# Patient Record
Sex: Male | Born: 1965
Health system: Southern US, Community
[De-identification: ages and names within clinical notes are randomized; demographics above are authoritative.]

## PROBLEM LIST (undated history)

## (undated) DIAGNOSIS — E785 Hyperlipidemia, unspecified: Secondary | ICD-10-CM

## (undated) DIAGNOSIS — I10 Essential (primary) hypertension: Secondary | ICD-10-CM

## (undated) HISTORY — DX: Essential (primary) hypertension: I10

## (undated) HISTORY — DX: Hyperlipidemia, unspecified: E78.5

---

## 2000-03-27 ENCOUNTER — Emergency Department (HOSPITAL_COMMUNITY): Admission: EM | Admit: 2000-03-27 | Discharge: 2000-03-27 | Payer: Self-pay | Admitting: Emergency Medicine

## 2000-03-27 ENCOUNTER — Encounter: Payer: Self-pay | Admitting: Emergency Medicine

## 2006-01-19 ENCOUNTER — Emergency Department (HOSPITAL_COMMUNITY): Admission: EM | Admit: 2006-01-19 | Discharge: 2006-01-19 | Payer: Self-pay | Admitting: Emergency Medicine

## 2008-07-06 ENCOUNTER — Emergency Department (HOSPITAL_COMMUNITY): Admission: EM | Admit: 2008-07-06 | Discharge: 2008-07-06 | Payer: Self-pay | Admitting: Emergency Medicine

## 2008-07-10 ENCOUNTER — Emergency Department (HOSPITAL_COMMUNITY): Admission: EM | Admit: 2008-07-10 | Discharge: 2008-07-10 | Payer: Self-pay | Admitting: *Deleted
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
## 2013-02-16 ENCOUNTER — Ambulatory Visit (INDEPENDENT_AMBULATORY_CARE_PROVIDER_SITE_OTHER): Payer: 59 | Admitting: Family Medicine

## 2013-02-16 VITALS — BP 149/93 | HR 64 | Temp 98.2°F | Resp 18 | Ht 74.0 in | Wt 238.0 lb

## 2013-02-16 DIAGNOSIS — H109 Unspecified conjunctivitis: Secondary | ICD-10-CM

## 2013-02-16 MED ORDER — OFLOXACIN 0.3 % OP SOLN
2.0000 [drp] | Freq: Four times a day (QID) | OPHTHALMIC | Status: DC
Start: 2013-02-16 — End: 2014-08-18

## 2013-02-16 NOTE — Progress Notes (Signed)
[address]   [place], [place]  [address]   [phone]  Subjective:    Patient ID: Travis Tyler, male    DOB: [birth year], 47 y.o.   MRN: [id number]  HPI This 47 y.o. male presents for evaluation of conjunctivitis L.  Onset yesterday.  Upon awakening, eye matted together with white discharge.  Normal vision; no blurred vision.  Leaking; watering.  +irritated; mild itching.  No pain but irritated.  No photophobia.  Intermittent foreign body sensation.  White drainage.  +throat sore since yesterday.  No rhinorrhea; no nasal congestion.  Visine to eye.  No sneezing.  No contacts or glasses.   Review of Systems  Constitutional: Negative for fever, chills, diaphoresis and fatigue.  HENT: Positive for sore throat. Negative for ear pain, congestion, rhinorrhea, sneezing, trouble swallowing, voice change and postnasal drip.   Eyes: Positive for discharge, redness and itching. Negative for photophobia, pain and visual disturbance.  Respiratory: Negative for cough.   Gastrointestinal: Negative for nausea, vomiting and diarrhea.  Skin: Negative for rash.  Neurological: Negative for headaches.    History reviewed. No pertinent past medical history.  History reviewed. No pertinent past surgical history.  Prior to Admission medications   Medication Sig Start Date End Date Taking? Authorizing Provider  ofloxacin (OCUFLOX) 0.3 % ophthalmic solution Place 2 drops into the left eye 4 (four) times daily. 02/16/13   Ethelda Chick, MD    No Known Allergies  History   Social History  . Marital Status: Married    Spouse Name: N/A    Number of Children: N/A  . Years of Education: N/A   Occupational History  . Not on file.   Social History Main Topics  . Smoking status: Current Every Day Smoker  . Smokeless tobacco: Not on file  . Alcohol Use: Yes  . Drug Use: No  . Sexually Active: Yes    Birth Control/ Protection: None   Other Topics Concern  . Not on file   Social History  Narrative  . No narrative on file    Family History  Problem Relation Age of Onset  . Cancer Father        Objective:   Physical Exam  Nursing note and vitals reviewed. Constitutional: He appears well-developed and well-nourished. No distress.  HENT:  Head: Normocephalic and atraumatic.  Right Ear: External ear normal.  Left Ear: External ear normal.  Nose: Nose normal.  Mouth/Throat: Oropharynx is clear and moist.  Eyes: EOM are normal. Pupils are equal, round, and reactive to light. Right eye exhibits no discharge, no exudate and no hordeolum. No foreign body present in the right eye. Left eye exhibits discharge. Left eye exhibits no hordeolum. No foreign body present in the left eye. Right conjunctiva is not injected. Right conjunctiva has no hemorrhage. Left conjunctiva is injected. Left conjunctiva has no hemorrhage. Right eye exhibits normal extraocular motion and no nystagmus. Left eye exhibits normal extraocular motion and no nystagmus.  Neck: Normal range of motion.  Cardiovascular: Normal rate and regular rhythm.   Pulmonary/Chest: Effort normal and breath sounds normal.  Lymphadenopathy:    He has no cervical adenopathy.  Skin: He is not diaphoretic.  Psychiatric: He has a normal mood and affect. His behavior is normal.   PROCEDURE NOTE: EYE INSPECTED; NO FOREIGN BODY; FLUORESCEIN APPLIED; NO UPTAKE.    Assessment & Plan:  Conjunctivitis - Plan: ofloxacin (OCUFLOX) 0.3 % ophthalmic solution   1.  L conjunctivitis:  New.  Associated with sore throat; consistent with viral syndrome yet mild to moderate drainage; thus, empirically treat with Ocuflox two gtts every six hours x 7 days.  OOW note x 1 day. 2. Pharyngitis: New. Consistent with viral syndrome; supportive care with rest, fluids.  Meds ordered this encounter  Medications  . ofloxacin (OCUFLOX) 0.3 % ophthalmic solution    Sig: Place 2 drops into the left eye 4 (four) times daily.    Dispense:  10 mL     Refill:  0

## 2013-02-16 NOTE — Patient Instructions (Addendum)
Conjunctivitis Conjunctivitis is commonly called "pink eye." Conjunctivitis can be caused by bacterial or viral infection, allergies, or injuries. There is usually redness of the lining of the eye, itching, discomfort, and sometimes discharge. There may be deposits of matter along the eyelids. A viral infection usually causes a watery discharge, while a bacterial infection causes a yellowish, thick discharge. Pink eye is very contagious and spreads by direct contact. You may be given antibiotic eyedrops as part of your treatment. Before using your eye medicine, remove all drainage from the eye by washing gently with warm water and cotton balls. Continue to use the medication until you have awakened 2 mornings in a row without discharge from the eye. Do not rub your eye. This increases the irritation and helps spread infection. Use separate towels from other household members. Wash your hands with soap and water before and after touching your eyes. Use cold compresses to reduce pain and sunglasses to relieve irritation from light. Do not wear contact lenses or wear eye makeup until the infection is gone. SEEK MEDICAL CARE IF:   Your symptoms are not better after 3 days of treatment.  You have increased pain or trouble seeing.  The outer eyelids become very red or swollen. Document Released: 11/06/2004 Document Revised: 12/22/2011 Document Reviewed: 09/29/2005 ExitCare Patient Information 2013 [employer].  

## 2013-04-26 ENCOUNTER — Ambulatory Visit
Admission: RE | Admit: 2013-04-26 | Discharge: 2013-04-26 | Disposition: A | Discharge status: Home / Self Care | Payer: 59 | Source: Ambulatory Visit | Attending: Orthopedic Surgery | Admitting: Orthopedic Surgery

## 2013-04-26 ENCOUNTER — Other Ambulatory Visit: Payer: Self-pay | Admitting: Orthopedic Surgery

## 2013-04-26 DIAGNOSIS — Z139 Encounter for screening, unspecified: Secondary | ICD-10-CM

## 2013-04-26 DIAGNOSIS — M25572 Pain in left ankle and joints of left foot: Secondary | ICD-10-CM

## 2013-04-26 DIAGNOSIS — M25472 Effusion, left ankle: Secondary | ICD-10-CM

## 2013-04-27 ENCOUNTER — Ambulatory Visit
Admission: RE | Admit: 2013-04-27 | Discharge: 2013-04-27 | Disposition: A | Discharge status: Home / Self Care | Payer: 59 | Source: Ambulatory Visit | Attending: Orthopedic Surgery | Admitting: Orthopedic Surgery

## 2013-04-27 DIAGNOSIS — M25572 Pain in left ankle and joints of left foot: Secondary | ICD-10-CM

## 2013-04-27 DIAGNOSIS — M25472 Effusion, left ankle: Secondary | ICD-10-CM

## 2014-08-15 ENCOUNTER — Other Ambulatory Visit: Payer: Self-pay | Admitting: Family Medicine

## 2014-08-15 ENCOUNTER — Other Ambulatory Visit: Payer: 59

## 2014-08-15 DIAGNOSIS — E785 Hyperlipidemia, unspecified: Secondary | ICD-10-CM

## 2014-08-15 DIAGNOSIS — Z0000 Encounter for general adult medical examination without abnormal findings: Secondary | ICD-10-CM

## 2014-08-15 DIAGNOSIS — Z79899 Other long term (current) drug therapy: Secondary | ICD-10-CM

## 2014-08-15 DIAGNOSIS — I10 Essential (primary) hypertension: Secondary | ICD-10-CM

## 2014-08-15 LAB — CBC WITH DIFFERENTIAL/PLATELET
Basophils Absolute: 0 10*3/uL (ref 0.0–0.1)
Basophils Relative: 0 % (ref 0–1)
EOS PCT: 1 % (ref 0–5)
Eosinophils Absolute: 0 10*3/uL (ref 0.0–0.7)
HCT: 39.5 % (ref 39.0–52.0)
HEMOGLOBIN: 14 g/dL (ref 13.0–17.0)
LYMPHS ABS: 1.9 10*3/uL (ref 0.7–4.0)
LYMPHS PCT: 50 % — AB (ref 12–46)
MCH: 31.5 pg (ref 26.0–34.0)
MCHC: 35.4 g/dL (ref 30.0–36.0)
MCV: 89 fL (ref 78.0–100.0)
MONOS PCT: 7 % (ref 3–12)
Monocytes Absolute: 0.3 10*3/uL (ref 0.1–1.0)
NEUTROS PCT: 42 % — AB (ref 43–77)
Neutro Abs: 1.6 10*3/uL — ABNORMAL LOW (ref 1.7–7.7)
PLATELETS: 257 10*3/uL (ref 150–400)
RBC: 4.44 MIL/uL (ref 4.22–5.81)
RDW: 13.2 % (ref 11.5–15.5)
WBC: 3.7 10*3/uL — AB (ref 4.0–10.5)

## 2014-08-15 LAB — LIPID PANEL
CHOL/HDL RATIO: 4.5 ratio
CHOLESTEROL: 200 mg/dL (ref 0–200)
HDL: 44 mg/dL (ref >39)
LDL Cholesterol: 135 mg/dL — ABNORMAL HIGH (ref 0–99)
Triglycerides: 104 mg/dL (ref <150)
VLDL: 21 mg/dL (ref 0–40)

## 2014-08-15 LAB — COMPLETE METABOLIC PANEL WITH GFR
ALK PHOS: 68 U/L (ref 39–117)
ALT: 14 U/L (ref 0–53)
AST: 17 U/L (ref 0–37)
Albumin: 4.4 g/dL (ref 3.5–5.2)
BILIRUBIN TOTAL: 1 mg/dL (ref 0.2–1.2)
BUN: 15 mg/dL (ref 6–23)
CO2: 28 mEq/L (ref 19–32)
Calcium: 9.1 mg/dL (ref 8.4–10.5)
Chloride: 106 mEq/L (ref 96–112)
Creat: 1.33 mg/dL (ref 0.50–1.35)
GFR, EST NON AFRICAN AMERICAN: 64 mL/min
GFR, Est African American: 74 mL/min
GLUCOSE: 88 mg/dL (ref 70–99)
Potassium: 4.5 mEq/L (ref 3.5–5.3)
Sodium: 140 mEq/L (ref 135–145)
Total Protein: 6.6 g/dL (ref 6.0–8.3)

## 2014-08-15 LAB — TSH: TSH: 0.946 u[IU]/mL (ref 0.350–4.500)

## 2014-08-18 ENCOUNTER — Encounter: Payer: Self-pay | Admitting: Family Medicine

## 2014-08-18 ENCOUNTER — Ambulatory Visit (INDEPENDENT_AMBULATORY_CARE_PROVIDER_SITE_OTHER): Payer: 59 | Admitting: Family Medicine

## 2014-08-18 VITALS — BP 118/76 | HR 72 | Temp 98.5°F | Resp 16 | Ht 72.0 in | Wt 236.0 lb

## 2014-08-18 DIAGNOSIS — Z23 Encounter for immunization: Secondary | ICD-10-CM

## 2014-08-18 DIAGNOSIS — Z0000 Encounter for general adult medical examination without abnormal findings: Secondary | ICD-10-CM

## 2014-08-18 NOTE — Progress Notes (Signed)
Subjective:    Patient ID: Travis Tyler, male    DOB: July 04, 1966, 48 y.o.   MRN: [id number]  HPIis a very pleasant 48 year old African-American male who is here today for complete physical exam. He has no concerns. His most recent lab work is listed below: Lab on 08/15/2014  Component Date Value Ref Range Status  . WBC 08/15/2014 3.7* 4.0 - 10.5 K/uL Final  . RBC 08/15/2014 4.44  4.22 - 5.81 MIL/uL Final  . Hemoglobin 08/15/2014 14.0  13.0 - 17.0 g/dL Final  . HCT 08/15/2014 39.5  39.0 - 52.0 % Final  . MCV 08/15/2014 89.0  78.0 - 100.0 fL Final  . MCH 08/15/2014 31.5  26.0 - 34.0 pg Final  . MCHC 08/15/2014 35.4  30.0 - 36.0 g/dL Final  . RDW 08/15/2014 13.2  11.5 - 15.5 % Final  . Platelets 08/15/2014 257  150 - 400 K/uL Final  . Neutrophils Relative % 08/15/2014 42* 43 - 77 % Final  . Neutro Abs 08/15/2014 1.6* 1.7 - 7.7 K/uL Final  . Lymphocytes Relative 08/15/2014 50* 12 - 46 % Final  . Lymphs Abs 08/15/2014 1.9  0.7 - 4.0 K/uL Final  . Monocytes Relative 08/15/2014 7  3 - 12 % Final  . Monocytes Absolute 08/15/2014 0.3  0.1 - 1.0 K/uL Final  . Eosinophils Relative 08/15/2014 1  0 - 5 % Final  . Eosinophils Absolute 08/15/2014 0.0  0.0 - 0.7 K/uL Final  . Basophils Relative 08/15/2014 0  0 - 1 % Final  . Basophils Absolute 08/15/2014 0.0  0.0 - 0.1 K/uL Final  . Smear Review 08/15/2014 Criteria for review not met   Final  . Cholesterol 08/15/2014 200  0 - 200 mg/dL Final   Comment: ATP III Classification:       < 200        mg/dL        Desirable      200 - 239     mg/dL        Borderline High      >= 240        mg/dL        High     . Triglycerides 08/15/2014 104  <150 mg/dL Final  . HDL 08/15/2014 44  >39 mg/dL Final  . Total CHOL/HDL Ratio 08/15/2014 4.5   Final  . VLDL 08/15/2014 21  0 - 40 mg/dL Final  . LDL Cholesterol 08/15/2014 135* 0 - 99 mg/dL Final   Comment:   Total Cholesterol/HDL Ratio:CHD Risk                        Coronary Heart Disease Risk  Table                                        Men       Women          1/2 Average Risk              3.4        3.3              Average Risk              5.0        4.4           2X Average Risk  9.6        7.1           3X Average Risk             23.4       11.0 Use the calculated Patient Ratio above and the CHD Risk table  to determine the patient's CHD Risk. ATP III Classification (LDL):       < 100        mg/dL         Optimal      100 - 129     mg/dL         Near or Above Optimal      130 - 159     mg/dL         Borderline High      160 - 189     mg/dL         High       > 190        mg/dL         Very High     . Sodium 08/15/2014 140  135 - 145 mEq/L Final  . Potassium 08/15/2014 4.5  3.5 - 5.3 mEq/L Final  . Chloride 08/15/2014 106  96 - 112 mEq/L Final  . CO2 08/15/2014 28  19 - 32 mEq/L Final  . Glucose, Bld 08/15/2014 88  70 - 99 mg/dL Final  . BUN 08/15/2014 15  6 - 23 mg/dL Final  . Creat 08/15/2014 1.33  0.50 - 1.35 mg/dL Final  . Total Bilirubin 08/15/2014 1.0  0.2 - 1.2 mg/dL Final  . Alkaline Phosphatase 08/15/2014 68  39 - 117 U/L Final  . AST 08/15/2014 17  0 - 37 U/L Final  . ALT 08/15/2014 14  0 - 53 U/L Final  . Total Protein 08/15/2014 6.6  6.0 - 8.3 g/dL Final  . Albumin 08/15/2014 4.4  3.5 - 5.2 g/dL Final  . Calcium 08/15/2014 9.1  8.4 - 10.5 mg/dL Final  . GFR, Est African American 08/15/2014 74   Final  . GFR, Est Non African American 08/15/2014 64   Final   Comment:   The estimated GFR is a calculation valid for adults (>=58 years old) that uses the CKD-EPI algorithm to adjust for age and sex. It is   not to be used for children, pregnant women, hospitalized patients,    patients on dialysis, or with rapidly changing kidney function. According to the NKDEP, eGFR >89 is normal, 60-89 shows mild impairment, 30-59 shows moderate impairment, 15-29 shows severe impairment and <15 is ESRD.     Marland Kitchen TSH 08/15/2014 0.946  0.350 - 4.500 uIU/mL  Final   No past medical history on file. No past surgical history on file. No current outpatient prescriptions on file prior to visit.   No current facility-administered medications on file prior to visit.   No Known Allergies History   Social History  . Marital Status: Married    Spouse Name: N/A    Number of Children: N/A  . Years of Education: N/A   Occupational History  . Not on file.   Social History Main Topics  . Smoking status: Former Research scientist (life sciences)  . Smokeless tobacco: Never Used     Comment: Uses e-cig  . Alcohol Use: 0.0 oz/week    0 Not specified per week  . Drug Use: No  . Sexual Activity: Yes    Birth Control/ Protection: None   Other Topics Concern  .  Not on file   Social History Narrative   Family History  Problem Relation Age of Onset  . Cancer Father       Review of Systems  All other systems reviewed and are negative.      Objective:   Physical Exam  Constitutional: He is oriented to person, place, and time. He appears well-developed and well-nourished. No distress.  HENT:  Head: Normocephalic and atraumatic.  Right Ear: External ear normal.  Left Ear: External ear normal.  Nose: Nose normal.  Mouth/Throat: Oropharynx is clear and moist. No oropharyngeal exudate.  Eyes: Conjunctivae and EOM are normal. Pupils are equal, round, and reactive to light. Right eye exhibits no discharge. Left eye exhibits no discharge. No scleral icterus.  Neck: Normal range of motion. Neck supple. No JVD present. No tracheal deviation present. No thyromegaly present.  Cardiovascular: Normal rate, regular rhythm, normal heart sounds and intact distal pulses.  Exam reveals no gallop and no friction rub.   No murmur heard. Pulmonary/Chest: Effort normal and breath sounds normal. No stridor. No respiratory distress. He has no wheezes. He has no rales. He exhibits no tenderness.  Abdominal: Soft. Bowel sounds are normal. He exhibits no distension and no mass. There is no  tenderness. There is no rebound and no guarding.  Genitourinary: Rectum normal and prostate normal.  Musculoskeletal: Normal range of motion. He exhibits no edema or tenderness.  Lymphadenopathy:    He has no cervical adenopathy.  Neurological: He is alert and oriented to person, place, and time. He has normal reflexes. He displays normal reflexes. No cranial nerve deficit. He exhibits normal muscle tone. Coordination normal.  Skin: Skin is warm. No rash noted. He is not diaphoretic. No erythema. No pallor.  Psychiatric: He has a normal mood and affect. His behavior is normal. Judgment and thought content normal.  Vitals reviewed.         Assessment & Plan:  Routine general medical examination at a health care facility  Patient's lab work is excellent. I did recommend that he decrease his consumption of saturated fat including red meat and pork to try to decrease his LDL cholesterol below 130. He will get his flu shot at work. Patient received his Tdap today in clinic. He is not yet due for colonoscopy. Prostate cancer screening was recommended today. His exam was normal there is no evidence of prostate nodules on exam. I will add a PSA to his lab work. I did recommend that he discontinue his use of e-cigs/vaps.

## 2014-08-18 NOTE — Addendum Note (Signed)
Addended by: Legrand RamsWILLIS, Saysha Menta B on: 08/18/2014 10:40 AM   Modules accepted: Orders

## 2014-08-19 LAB — PSA: PSA: 0.42 ng/mL (ref <=4.00)

## 2014-08-23 ENCOUNTER — Encounter: Payer: Self-pay | Admitting: Family Medicine

## 2015-08-21 ENCOUNTER — Other Ambulatory Visit: Payer: Commercial Managed Care - HMO

## 2015-08-21 DIAGNOSIS — E785 Hyperlipidemia, unspecified: Secondary | ICD-10-CM

## 2015-08-21 DIAGNOSIS — I10 Essential (primary) hypertension: Secondary | ICD-10-CM

## 2015-08-21 DIAGNOSIS — Z0000 Encounter for general adult medical examination without abnormal findings: Secondary | ICD-10-CM

## 2015-08-21 LAB — CBC WITH DIFFERENTIAL/PLATELET
BASOS ABS: 0 10*3/uL (ref 0.0–0.1)
Basophils Relative: 0 % (ref 0–1)
EOS ABS: 0 10*3/uL (ref 0.0–0.7)
EOS PCT: 1 % (ref 0–5)
HCT: 41.2 % (ref 39.0–52.0)
Hemoglobin: 14.1 g/dL (ref 13.0–17.0)
Lymphocytes Relative: 42 % (ref 12–46)
Lymphs Abs: 1.4 10*3/uL (ref 0.7–4.0)
MCH: 31.5 pg (ref 26.0–34.0)
MCHC: 34.2 g/dL (ref 30.0–36.0)
MCV: 92.2 fL (ref 78.0–100.0)
MPV: 9.9 fL (ref 8.6–12.4)
Monocytes Absolute: 0.2 10*3/uL (ref 0.1–1.0)
Monocytes Relative: 7 % (ref 3–12)
Neutro Abs: 1.7 10*3/uL (ref 1.7–7.7)
Neutrophils Relative %: 50 % (ref 43–77)
PLATELETS: 223 10*3/uL (ref 150–400)
RBC: 4.47 MIL/uL (ref 4.22–5.81)
RDW: 13.2 % (ref 11.5–15.5)
WBC: 3.3 10*3/uL — AB (ref 4.0–10.5)

## 2015-08-21 LAB — COMPLETE METABOLIC PANEL WITH GFR
ALBUMIN: 4.2 g/dL (ref 3.6–5.1)
ALT: 13 U/L (ref 9–46)
AST: 16 U/L (ref 10–40)
Alkaline Phosphatase: 74 U/L (ref 40–115)
BUN: 17 mg/dL (ref 7–25)
CALCIUM: 9.5 mg/dL (ref 8.6–10.3)
CHLORIDE: 107 mmol/L (ref 98–110)
CO2: 26 mmol/L (ref 20–31)
CREATININE: 1.11 mg/dL (ref 0.60–1.35)
GFR, Est African American: >89 mL/min (ref >=60)
GFR, Est Non African American: 79 mL/min (ref >=60)
GLUCOSE: 76 mg/dL (ref 70–99)
Potassium: 4.6 mmol/L (ref 3.5–5.3)
SODIUM: 142 mmol/L (ref 135–146)
Total Bilirubin: 0.9 mg/dL (ref 0.2–1.2)
Total Protein: 6.8 g/dL (ref 6.1–8.1)

## 2015-08-21 LAB — TSH: TSH: 1.186 u[IU]/mL (ref 0.350–4.500)

## 2015-08-21 LAB — LIPID PANEL
Cholesterol: 176 mg/dL (ref 125–200)
HDL: 48 mg/dL (ref >=40)
LDL Cholesterol: 116 mg/dL (ref <130)
TRIGLYCERIDES: 59 mg/dL (ref <150)
Total CHOL/HDL Ratio: 3.7 Ratio (ref <=5.0)
VLDL: 12 mg/dL (ref <30)

## 2015-08-22 LAB — PSA: PSA: 0.57 ng/mL (ref <=4.00)

## 2015-08-24 ENCOUNTER — Encounter: Payer: Self-pay | Admitting: Family Medicine

## 2015-08-24 ENCOUNTER — Ambulatory Visit (INDEPENDENT_AMBULATORY_CARE_PROVIDER_SITE_OTHER): Payer: Commercial Managed Care - HMO | Admitting: Family Medicine

## 2015-08-24 VITALS — BP 128/88 | HR 60 | Temp 98.8°F | Resp 14 | Ht 72.0 in | Wt 220.0 lb

## 2015-08-24 DIAGNOSIS — Z0000 Encounter for general adult medical examination without abnormal findings: Secondary | ICD-10-CM | POA: Diagnosis not present

## 2015-08-24 NOTE — Progress Notes (Signed)
Subjective:    Patient ID: Travis Tyler, male    DOB: 14-Nov-1965, 49 y.o.   MRN: [id number]  HPI is a very pleasant 49 year old African-American male who is here today for complete physical exam. He has no concerns. Since last year's physical, he has lost 16 pounds. He is exercising every day. He has drastically changed his diet. His lab work shows tremendous improvement due to his lifestyle changesHis most recent lab work is listed below: Lab on 08/21/2015  Component Date Value Ref Range Status  . WBC 08/21/2015 3.3* 4.0 - 10.5 K/uL Final  . RBC 08/21/2015 4.47  4.22 - 5.81 MIL/uL Final  . Hemoglobin 08/21/2015 14.1  13.0 - 17.0 g/dL Final  . HCT 08/21/2015 41.2  39.0 - 52.0 % Final  . MCV 08/21/2015 92.2  78.0 - 100.0 fL Final  . MCH 08/21/2015 31.5  26.0 - 34.0 pg Final  . MCHC 08/21/2015 34.2  30.0 - 36.0 g/dL Final  . RDW 08/21/2015 13.2  11.5 - 15.5 % Final  . Platelets 08/21/2015 223  150 - 400 K/uL Final  . MPV 08/21/2015 9.9  8.6 - 12.4 fL Final  . Neutrophils Relative % 08/21/2015 50  43 - 77 % Final  . Neutro Abs 08/21/2015 1.7  1.7 - 7.7 K/uL Final  . Lymphocytes Relative 08/21/2015 42  12 - 46 % Final  . Lymphs Abs 08/21/2015 1.4  0.7 - 4.0 K/uL Final  . Monocytes Relative 08/21/2015 7  3 - 12 % Final  . Monocytes Absolute 08/21/2015 0.2  0.1 - 1.0 K/uL Final  . Eosinophils Relative 08/21/2015 1  0 - 5 % Final  . Eosinophils Absolute 08/21/2015 0.0  0.0 - 0.7 K/uL Final  . Basophils Relative 08/21/2015 0  0 - 1 % Final  . Basophils Absolute 08/21/2015 0.0  0.0 - 0.1 K/uL Final  . Smear Review 08/21/2015 Criteria for review not met   Final  . Cholesterol 08/21/2015 176  125 - 200 mg/dL Final  . Triglycerides 08/21/2015 59  <150 mg/dL Final  . HDL 08/21/2015 48  >=40 mg/dL Final  . Total CHOL/HDL Ratio 08/21/2015 3.7  <=5.0 Ratio Final  . VLDL 08/21/2015 12  <30 mg/dL Final  . LDL Cholesterol 08/21/2015 116  <130 mg/dL Final   Comment:   Total Cholesterol/HDL  Ratio:CHD Risk                        Coronary Heart Disease Risk Table                                        Men       Women          1/2 Average Risk              3.4        3.3              Average Risk              5.0        4.4           2X Average Risk              9.6        7.1           3X Average Risk  23.4       11.0 Use the calculated Patient Ratio above and the CHD Risk table  to determine the patient's CHD Risk.   Marland Kitchen PSA 08/21/2015 0.57  <=4.00 ng/mL Final   Comment: Test Methodology: ECLIA PSA (Electrochemiluminescence Immunoassay)   For PSA values from 2.5-4.0, particularly in younger men <57 years old, the AUA and NCCN suggest testing for % Free PSA (3515) and evaluation of the rate of increase in PSA (PSA velocity).   . TSH 08/21/2015 1.186  0.350 - 4.500 uIU/mL Final  . Sodium 08/21/2015 142  135 - 146 mmol/L Final  . Potassium 08/21/2015 4.6  3.5 - 5.3 mmol/L Final  . Chloride 08/21/2015 107  98 - 110 mmol/L Final  . CO2 08/21/2015 26  20 - 31 mmol/L Final  . Glucose, Bld 08/21/2015 76  70 - 99 mg/dL Final  . BUN 08/21/2015 17  7 - 25 mg/dL Final  . Creat 08/21/2015 1.11  0.60 - 1.35 mg/dL Final  . Total Bilirubin 08/21/2015 0.9  0.2 - 1.2 mg/dL Final  . Alkaline Phosphatase 08/21/2015 74  40 - 115 U/L Final  . AST 08/21/2015 16  10 - 40 U/L Final  . ALT 08/21/2015 13  9 - 46 U/L Final  . Total Protein 08/21/2015 6.8  6.1 - 8.1 g/dL Final  . Albumin 08/21/2015 4.2  3.6 - 5.1 g/dL Final  . Calcium 08/21/2015 9.5  8.6 - 10.3 mg/dL Final  . GFR, Est African American 08/21/2015 >89  >=60 mL/min Final  . GFR, Est Non African American 08/21/2015 79  >=60 mL/min Final   Comment:   The estimated GFR is a calculation valid for adults (>=36 years old) that uses the CKD-EPI algorithm to adjust for age and sex. It is   not to be used for children, pregnant women, hospitalized patients,    patients on dialysis, or with rapidly changing kidney  function. According to the NKDEP, eGFR >89 is normal, 60-89 shows mild impairment, 30-59 shows moderate impairment, 15-29 shows severe impairment and <15 is ESRD.      No past medical history on file. No past surgical history on file. No current outpatient prescriptions on file prior to visit.   No current facility-administered medications on file prior to visit.   No Known Allergies Social History   Social History  . Marital Status: Married    Spouse Name: N/A  . Number of Children: N/A  . Years of Education: N/A   Occupational History  . Not on file.   Social History Main Topics  . Smoking status: Former Research scientist (life sciences)  . Smokeless tobacco: Never Used     Comment: Uses e-cig  . Alcohol Use: 0.0 oz/week    0 Standard drinks or equivalent per week  . Drug Use: No  . Sexual Activity: Yes    Birth Control/ Protection: None   Other Topics Concern  . Not on file   Social History Narrative   Family History  Problem Relation Age of Onset  . Cancer Father       Review of Systems  All other systems reviewed and are negative.      Objective:   Physical Exam  Constitutional: He is oriented to person, place, and time. He appears well-developed and well-nourished. No distress.  HENT:  Head: Normocephalic and atraumatic.  Right Ear: External ear normal.  Left Ear: External ear normal.  Nose: Nose normal.  Mouth/Throat: Oropharynx is clear and moist. No oropharyngeal exudate.  Eyes: Conjunctivae and EOM are normal. Pupils are equal, round, and reactive to light. Right eye exhibits no discharge. Left eye exhibits no discharge. No scleral icterus.  Neck: Normal range of motion. Neck supple. No JVD present. No tracheal deviation present. No thyromegaly present.  Cardiovascular: Normal rate, regular rhythm, normal heart sounds and intact distal pulses.  Exam reveals no gallop and no friction rub.   No murmur heard. Pulmonary/Chest: Effort normal and breath sounds normal. No  stridor. No respiratory distress. He has no wheezes. He has no rales. He exhibits no tenderness.  Abdominal: Soft. Bowel sounds are normal. He exhibits no distension and no mass. There is no tenderness. There is no rebound and no guarding.  Genitourinary: Rectum normal and prostate normal.  Musculoskeletal: Normal range of motion. He exhibits no edema or tenderness.  Lymphadenopathy:    He has no cervical adenopathy.  Neurological: He is alert and oriented to person, place, and time. He has normal reflexes. No cranial nerve deficit. He exhibits normal muscle tone. Coordination normal.  Skin: Skin is warm. No rash noted. He is not diaphoretic. No erythema. No pallor.  Psychiatric: He has a normal mood and affect. His behavior is normal. Judgment and thought content normal.  Vitals reviewed.         Assessment & Plan:  Routine general medical examination at a health care facility  Patient's lab work is excellent.  Over the last year, he was able to reduce his LDL cholesterol by 20 points!  He will get his flu shot at work. He is not yet due for colonoscopy. Prostate cancer screening was recommended today. His exam was normal there is no evidence of prostate nodules on exam. PSA was excellent. I did recommend that he discontinue his use of e-cigs/vaps.

## 2016-09-15 ENCOUNTER — Ambulatory Visit (INDEPENDENT_AMBULATORY_CARE_PROVIDER_SITE_OTHER): Payer: Commercial Managed Care - HMO | Admitting: Physician Assistant

## 2016-09-15 VITALS — BP 130/80 | HR 62 | Temp 98.3°F | Resp 16 | Wt 222.0 lb

## 2016-09-15 DIAGNOSIS — M5441 Lumbago with sciatica, right side: Secondary | ICD-10-CM

## 2016-09-15 MED ORDER — HYDROCODONE-ACETAMINOPHEN 5-325 MG PO TABS
1.0000 | ORAL_TABLET | Freq: Four times a day (QID) | ORAL | 0 refills | Status: DC | PRN
Start: 2016-09-15 — End: 2016-10-17

## 2016-09-15 MED ORDER — CYCLOBENZAPRINE HCL 10 MG PO TABS: 10.0000 mg | ORAL_TABLET | Freq: Three times a day (TID) | ORAL | 0 refills | Status: DC | PRN

## 2016-09-15 MED ORDER — METHYLPREDNISOLONE ACETATE 80 MG/ML IJ SUSP
80.0000 mg | Freq: Once | INTRAMUSCULAR | Status: AC
  Administered 2016-09-15: 80 mg via INTRAMUSCULAR

## 2016-09-15 NOTE — Progress Notes (Signed)
Patient ID: Travis Tyler MRN: [id number], DOB: [birth year], 50 y.o. Date of Encounter: 09/15/2016, 11:16 AM    Chief Complaint:  Chief Complaint  Patient presents with  . Back Pain    working out at gym x 2 wk     HPI: 50 y.o. year old male presents with above.   He has no prior past history of problems with back pain. Says that he is having pain across his low back going down his right leg. Says he went to the chiropractor last week and they did x-rays and said he had inflammation in his back. Says that he had gone to the gym and was doing weights with his legs and that's when he developed significant pain. Visit when he stands up and tries to walk and weight-bear the pain is worse.  For his job [place]. Says is a lot of bending and lifting pushing pulling etc.  Feels pain going down the right leg at times. Has had no numbness or weakness in that leg. Had no pain numbness or weakness down the left leg. No cauda equina symptoms or incontinence of bowel or bladder.     Home Meds:   No outpatient prescriptions prior to visit.   No facility-administered medications prior to visit.     Allergies: No Known Allergies    Review of Systems: See HPI for pertinent ROS. All other ROS negative.    Physical Exam: Blood pressure 130/80, pulse 62, temperature 98.3 F (36.8 C), temperature source Oral, resp. rate 16, weight 222 lb (100.7 kg), SpO2 99 %., Body mass index is 30.11 kg/m. General:  WNWD AAM. Appears in no acute distress. Neck: Supple. No thyromegaly. No lymphadenopathy. Lungs: Clear bilaterally to auscultation without wheezes, rales, or rhonchi. Breathing is unlabored. Heart: Regular rhythm. No murmurs, rubs, or gallops. Msk:  Strength and tone normal for age. No tenderness with palpation of low back or sciatic notch bilaterally. He is going to have him get on exam table to do straight leg raise and hip abduction--- he cannot lie  flat on his back. He tries to do so he ends up having to lie on his left side secondary to pain and tightness. Extremities/Skin: Warm and dry. Neuro: Alert and oriented X 3. Moves all extremities spontaneously. Gait is normal. CNII-XII grossly in tact. Psych:  Responds to questions appropriately with a normal affect.     ASSESSMENT AND PLAN:  50 y.o. year old male with  1. Acute bilateral low back pain with right-sided sciatica Give Depo-Medrol 80 mg IM now. He will use the Flexeril as directed. I did caution him about possible drowsiness with this. He says that he has not been able to get much sleep because of this pain. Will give him some hydrocodone to use for severe pain. Will write him note to keep her out of work today and tomorrow with plans to return Wednesday 09/17/16. Once he returns to work he is to use the hydrocodone only at night and not when working. Also if the Flexeril causes drowsiness and he is not to take this when working. I'll up if pain not significantly improved in 48 hours - cyclobenzaprine (FLEXERIL) 10 MG tablet; Take 1 tablet (10 mg total) by mouth 3 (three) times daily as needed for muscle spasms.  Dispense: 30 tablet; Refill: 0 - HYDROcodone-acetaminophen (NORCO/VICODIN) 5-325 MG tablet; Take 1 tablet by mouth every 6 (six) hours as needed.  Dispense: 30 tablet; Refill:  0   Signed, [address]Simranjit Thayer Beth La CuevaDixon, [place], [facility] 09/15/2016 11:16 AM

## 2016-09-17 ENCOUNTER — Telehealth: Payer: Self-pay

## 2016-09-17 NOTE — Telephone Encounter (Signed)
Pt can pick note up at front desk  12-7

## 2016-09-17 NOTE — Telephone Encounter (Signed)
Pt states he went in to work today, but had to leave because his leg is still hurting the pain has eased up a little , but it still bothers him when he walks.  Pt is asking if he can have a work note to be out today and tomorrow and return 12-11 pt states he is off on 12-8 Pls advise

## 2016-09-17 NOTE — Telephone Encounter (Signed)
Yes--Approved. Can write note to be out of work the dates he documented.

## 2016-10-17 ENCOUNTER — Encounter: Payer: Self-pay | Admitting: Family Medicine

## 2016-10-17 ENCOUNTER — Ambulatory Visit (INDEPENDENT_AMBULATORY_CARE_PROVIDER_SITE_OTHER): Payer: Commercial Managed Care - HMO | Admitting: Family Medicine

## 2016-10-17 VITALS — HR 80 | Temp 99.1°F | Resp 14 | Ht 72.0 in | Wt 222.0 lb

## 2016-10-17 DIAGNOSIS — R030 Elevated blood-pressure reading, without diagnosis of hypertension: Secondary | ICD-10-CM

## 2016-10-17 DIAGNOSIS — Z23 Encounter for immunization: Secondary | ICD-10-CM | POA: Diagnosis not present

## 2016-10-17 DIAGNOSIS — M5441 Lumbago with sciatica, right side: Secondary | ICD-10-CM

## 2016-10-17 DIAGNOSIS — Z0000 Encounter for general adult medical examination without abnormal findings: Secondary | ICD-10-CM | POA: Diagnosis not present

## 2016-10-17 LAB — CBC WITH DIFFERENTIAL/PLATELET
Basophils Absolute: 0 cells/uL (ref 0–200)
Basophils Relative: 0 %
EOS PCT: 1 %
Eosinophils Absolute: 36 cells/uL (ref 15–500)
HCT: 41.6 % (ref 38.5–50.0)
HEMOGLOBIN: 14.2 g/dL (ref 13.0–17.0)
LYMPHS ABS: 1584 {cells}/uL (ref 850–3900)
Lymphocytes Relative: 44 %
MCH: 31.6 pg (ref 27.0–33.0)
MCHC: 34.1 g/dL (ref 32.0–36.0)
MCV: 92.7 fL (ref 80.0–100.0)
MONOS PCT: 6 %
MPV: 9.6 fL (ref 7.5–12.5)
Monocytes Absolute: 216 cells/uL (ref 200–950)
NEUTROS ABS: 1764 {cells}/uL (ref 1500–7800)
Neutrophils Relative %: 49 %
PLATELETS: 240 10*3/uL (ref 140–400)
RBC: 4.49 MIL/uL (ref 4.20–5.80)
RDW: 13.5 % (ref 11.0–15.0)
WBC: 3.6 10*3/uL — AB (ref 3.8–10.8)

## 2016-10-17 LAB — PSA: PSA: 0.5 ng/mL (ref <=4.0)

## 2016-10-17 NOTE — Progress Notes (Signed)
Subjective:    Patient ID: Travis Tyler, male    DOB: [birth year], 51 y.o.   MRN: [id number]  HPI  He is a very pleasant 51 year old African-American male who is here today for complete physical exam.   The first week in November, he injured his lower back. He has neuropathic severe pain radiating from his lower back into his right posterior gluteus down his right leg and into his right foot. Symptoms have been present for more than 6 weeks. He is seen a chiropractor who states that his x-rays show degenerative disc disease between L4 and L5. He is seen my PA who put him on a prednisone taper pack that helped transiently. He is in persistent severe pain and is interested in further options for pain control to help manage his sciatica. His blood pressure slightly high today however he attributes this to his pain. He would like to defer treatment of his blood pressure until we address the pain No past medical history on file. No past surgical history on file. No current outpatient prescriptions on file prior to visit.   No current facility-administered medications on file prior to visit.    No Known Allergies Social History   Social History  . Marital status: Married    Spouse name: N/A  . Number of children: N/A  . Years of education: N/A   Occupational History  . Not on file.   Social History Main Topics  . Smoking status: Former Games developermoker  . Smokeless tobacco: Never Used     Comment: Uses e-cig  . Alcohol use 0.0 oz/week  . Drug use: No  . Sexual activity: Yes    Birth control/ protection: None   Other Topics Concern  . Not on file   Social History Narrative  . No narrative on file   Family History  Problem Relation Age of Onset  . Cancer Father       Review of Systems  All other systems reviewed and are negative.      Objective:   Physical Exam  Constitutional: He is oriented to person, place, and time. He appears well-developed and well-nourished. No  distress.  HENT:  Head: Normocephalic and atraumatic.  Right Ear: External ear normal.  Left Ear: External ear normal.  Nose: Nose normal.  Mouth/Throat: Oropharynx is clear and moist. No oropharyngeal exudate.  Eyes: Conjunctivae and EOM are normal. Pupils are equal, round, and reactive to light. Right eye exhibits no discharge. Left eye exhibits no discharge. No scleral icterus.  Neck: Normal range of motion. Neck supple. No JVD present. No tracheal deviation present. No thyromegaly present.  Cardiovascular: Normal rate, regular rhythm, normal heart sounds and intact distal pulses.  Exam reveals no gallop and no friction rub.   No murmur heard. Pulmonary/Chest: Effort normal and breath sounds normal. No stridor. No respiratory distress. He has no wheezes. He has no rales. He exhibits no tenderness.  Abdominal: Soft. Bowel sounds are normal. He exhibits no distension and no mass. There is no tenderness. There is no rebound and no guarding.  Genitourinary: Rectum normal and prostate normal.  Musculoskeletal: Normal range of motion. He exhibits no edema or tenderness.  Lymphadenopathy:    He has no cervical adenopathy.  Neurological: He is alert and oriented to person, place, and time. He has normal reflexes. No cranial nerve deficit. He exhibits normal muscle tone. Coordination normal.  Skin: Skin is warm. No rash noted. He is not diaphoretic. No erythema. No pallor.  Psychiatric: He has a normal mood and affect. His behavior is normal. Judgment and thought content normal.  Vitals reviewed.         Assessment & Plan:  Routine general medical examination at a health care facility - Plan: CBC with Differential/Platelet, COMPLETE METABOLIC PANEL WITH GFR, Lipid panel, PSA  Acute bilateral low back pain with right-sided sciatica - Plan: MR Lumbar Spine Wo Contrast  Single episode of elevated blood pressure Blood pressure is high but I believe is due to the fact he is in pain. We will  address his right-sided sciatica and then recheck his blood pressure in one month. If his pain is better and his blood pressure remains elevated, I will start the patient on an angiotensin receptor blocker with a goal blood pressure less than 140/90. He received his flu shot today. I will check a CBC, CMP, fasting lipid panel and given the fact he is an African-American over the age of 44 also check a PSA. His rectal exam today is normal. Given his sciatica present for more than 6 weeks, I will obtain an MRI of the lumbar spine to evaluate further. Patient would be interested in an epidural steroid injection if it would help his pain. Await the results of MRI first

## 2016-10-17 NOTE — Addendum Note (Signed)
Addended by: Legrand RamsWILLIS, Yug Loria B on: 10/17/2016 12:33 PM   Modules accepted: Orders

## 2016-10-18 LAB — LIPID PANEL
CHOL/HDL RATIO: 3.9 ratio (ref <5.0)
Cholesterol: 210 mg/dL — ABNORMAL HIGH (ref <200)
HDL: 54 mg/dL (ref >40)
LDL CALC: 136 mg/dL — AB (ref <100)
Triglycerides: 99 mg/dL (ref <150)
VLDL: 20 mg/dL (ref <30)

## 2016-10-18 LAB — COMPLETE METABOLIC PANEL WITH GFR
ALK PHOS: 61 U/L (ref 40–115)
ALT: 22 U/L (ref 9–46)
AST: 31 U/L (ref 10–40)
Albumin: 4.3 g/dL (ref 3.6–5.1)
BUN: 14 mg/dL (ref 7–25)
CO2: 23 mmol/L (ref 20–31)
Calcium: 9 mg/dL (ref 8.6–10.3)
Chloride: 108 mmol/L (ref 98–110)
Creat: 1 mg/dL (ref 0.60–1.35)
GFR, Est African American: >89 mL/min (ref >=60)
GFR, Est Non African American: 89 mL/min (ref >=60)
GLUCOSE: 90 mg/dL (ref 70–99)
POTASSIUM: 4 mmol/L (ref 3.5–5.3)
SODIUM: 141 mmol/L (ref 135–146)
TOTAL PROTEIN: 7 g/dL (ref 6.1–8.1)
Total Bilirubin: 1.2 mg/dL (ref 0.2–1.2)

## 2016-10-20 ENCOUNTER — Encounter: Payer: Self-pay | Admitting: Family Medicine

## 2016-10-23 ENCOUNTER — Ambulatory Visit
Admission: RE | Admit: 2016-10-23 | Discharge: 2016-10-23 | Disposition: A | Discharge status: Home / Self Care | Payer: Commercial Managed Care - HMO | Source: Ambulatory Visit | Attending: Family Medicine | Admitting: Family Medicine

## 2016-10-23 DIAGNOSIS — M5441 Lumbago with sciatica, right side: Secondary | ICD-10-CM

## 2016-10-31 ENCOUNTER — Other Ambulatory Visit: Payer: Self-pay | Admitting: Family Medicine

## 2016-11-04 ENCOUNTER — Telehealth: Payer: Self-pay | Admitting: Family Medicine

## 2016-11-04 ENCOUNTER — Other Ambulatory Visit: Payer: Self-pay | Admitting: Family Medicine

## 2016-11-04 DIAGNOSIS — M5441 Lumbago with sciatica, right side: Secondary | ICD-10-CM

## 2016-11-04 NOTE — Telephone Encounter (Signed)
-----   Message from Ricard DillonSandy B Willis sent at 10/31/2016 12:46 PM EST ----- [facility] - how do I order that if they go to [place] [facility] and not ortho????

## 2016-11-10 ENCOUNTER — Ambulatory Visit
Admission: RE | Admit: 2016-11-10 | Discharge: 2016-11-10 | Disposition: A | Discharge status: Home / Self Care | Payer: Commercial Managed Care - HMO | Source: Ambulatory Visit | Attending: Family Medicine | Admitting: Family Medicine

## 2016-11-10 DIAGNOSIS — M5441 Lumbago with sciatica, right side: Secondary | ICD-10-CM

## 2016-11-10 MED ORDER — IOPAMIDOL (ISOVUE-M 200) INJECTION 41%
1.0000 mL | Freq: Once | INTRAMUSCULAR | Status: AC
  Administered 2016-11-10: 1 mL via EPIDURAL

## 2016-11-10 MED ORDER — METHYLPREDNISOLONE ACETATE 40 MG/ML INJ SUSP (RADIOLOG
120.0000 mg | Freq: Once | INTRAMUSCULAR | Status: AC
  Administered 2016-11-10: 120 mg via EPIDURAL

## 2016-11-10 NOTE — Discharge Instructions (Signed)

## 2017-12-10 IMAGING — MR MR LUMBAR SPINE W/O CM
5 series · 48 of 48 positions shown · non-contrast
Comparison: None.

CLINICAL DATA: Low back and right leg pain for 2 months. Patient
reports a low back injury lifting weights.

EXAM:
MRI LUMBAR SPINE WITHOUT CONTRAST
TECHNIQUE: Multiplanar, multisequence MR imaging of the lumbar spine was
performed. No intravenous contrast was administered.

[Series 3: T2 · sagittal · 4.0mm · 0.91mm/px · 6 of 13 slices shown (1 of 2)]
[im 1/13]
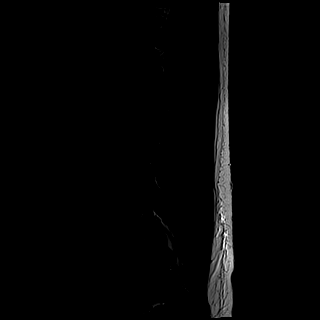
[im 3/13]
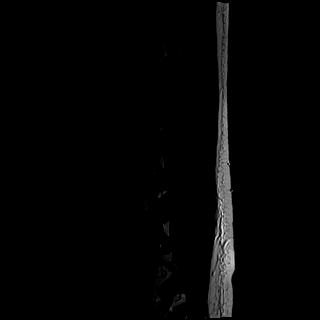
[im 5/13]
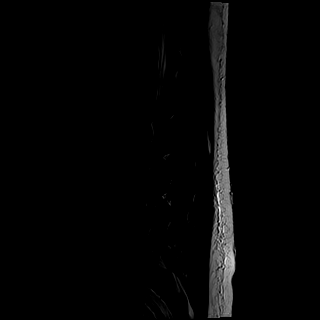
[im 8/13]
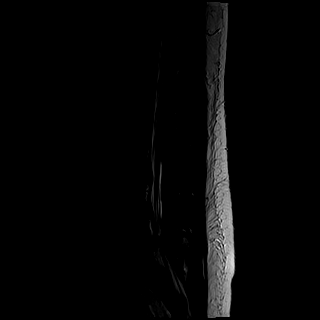
[im 10/13]
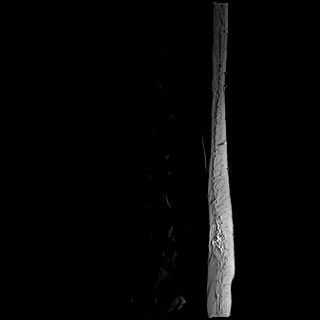
[im 13/13]
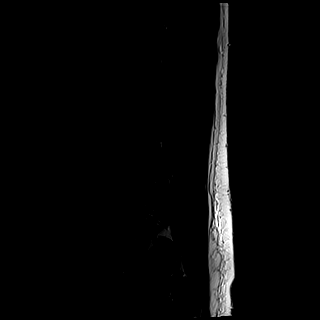

[Series 4: T1 · sagittal · 4.0mm · 0.91mm/px · 6 of 13 slices shown (1 of 2)]
[im 1/13]
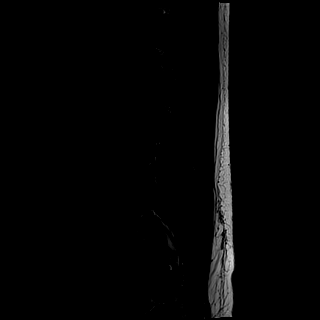
[im 3/13]
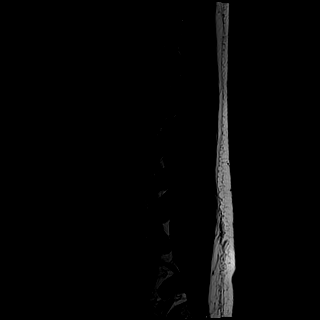
[im 5/13]
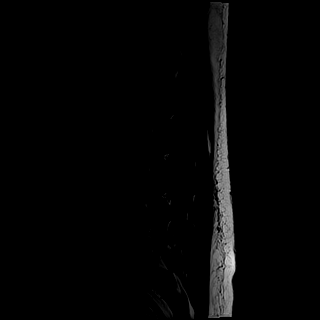
[im 8/13]
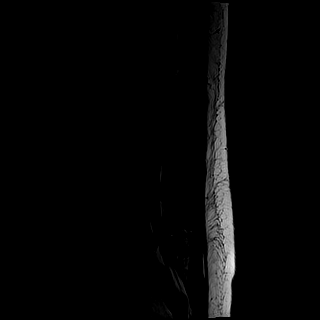
[im 10/13]
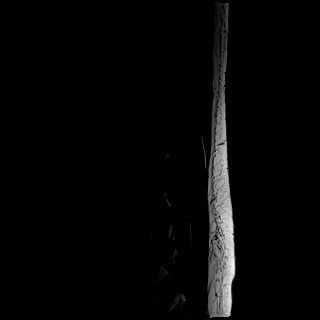
[im 13/13]
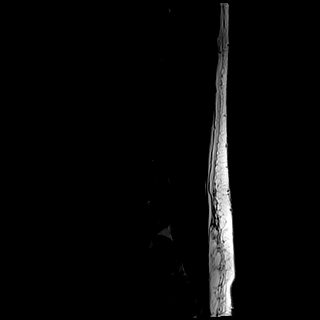

[Series 5: tirm sag · sagittal · 4.0mm · 0.91mm/px · 6 of 13 slices shown]
[im 1/13]
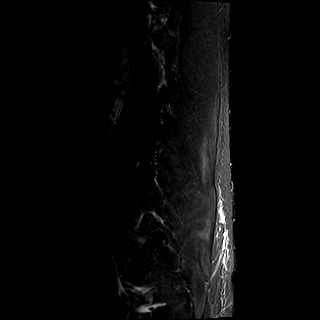
[im 3/13]
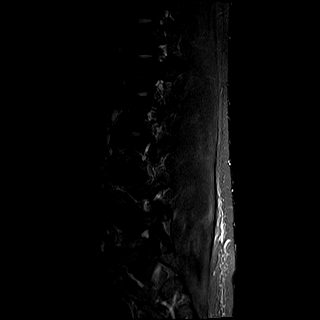
[im 5/13]
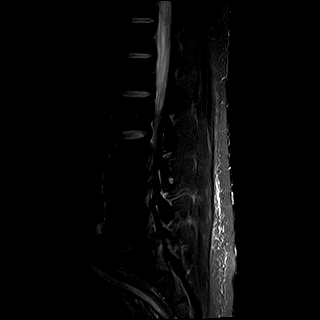
[im 8/13]
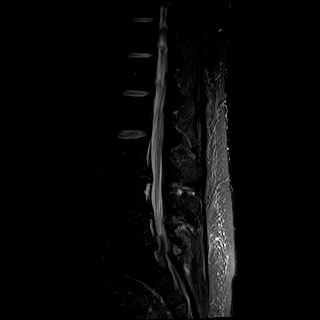
[im 10/13]
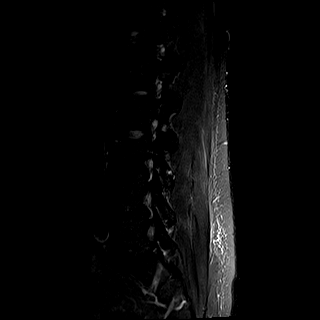
[im 13/13]
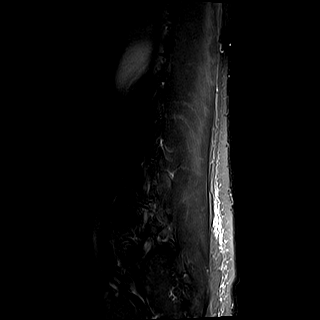

[Series 6: T2 · axial · 4.0mm · 0.74mm/px · z∈[-107,+105]mm · 15 of 32 slices shown (2 of 2)]
[im 1/32]
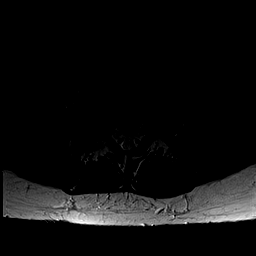
[im 3/32]
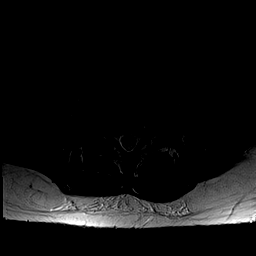
[im 5/32]
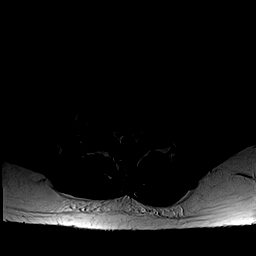
[im 7/32]
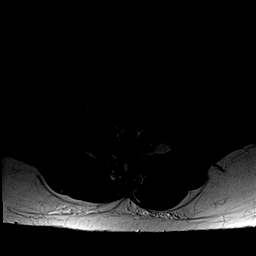
[im 9/32]
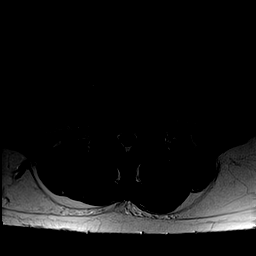
[im 12/32]
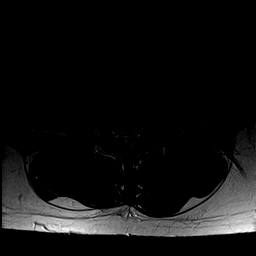
[im 14/32]
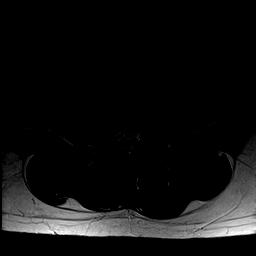
[im 16/32]
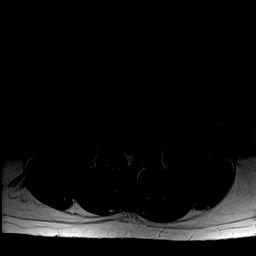
[im 18/32]
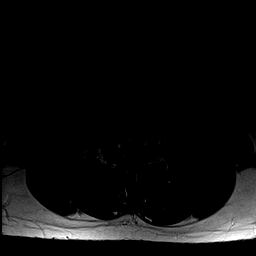
[im 20/32]
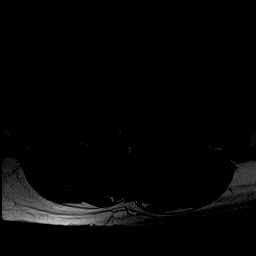
[im 23/32]
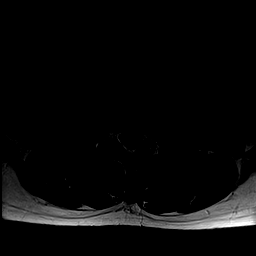
[im 25/32]
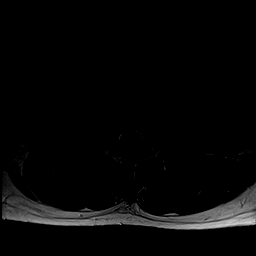
[im 27/32]
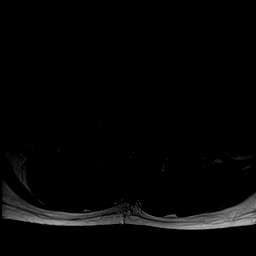
[im 29/32]
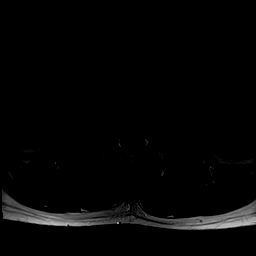
[im 32/32]
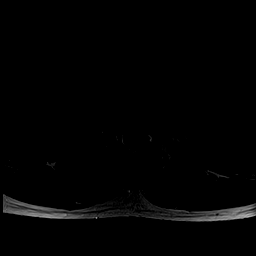

[Series 8: T1 · axial · 4.0mm · 0.78mm/px · z∈[-107,+106]mm · 15 of 32 slices shown (2 of 2)]
[im 1/32]
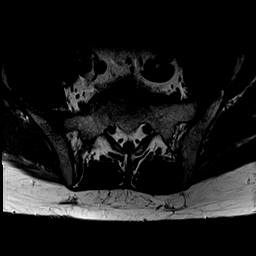
[im 3/32]
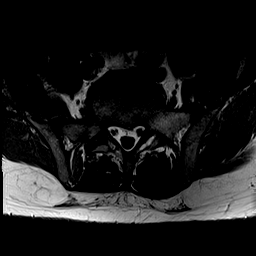
[im 5/32]
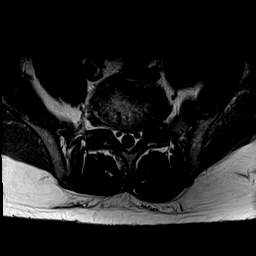
[im 7/32]
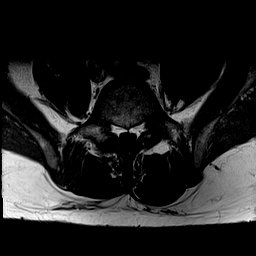
[im 9/32]
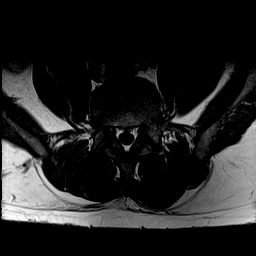
[im 12/32]
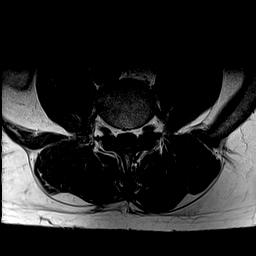
[im 14/32]
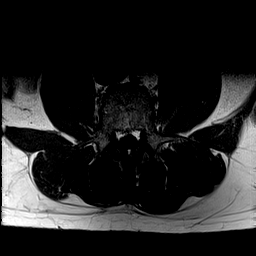
[im 16/32]
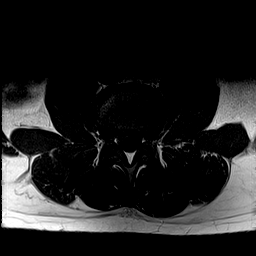
[im 18/32]
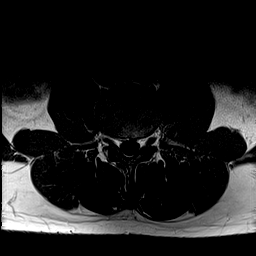
[im 20/32]
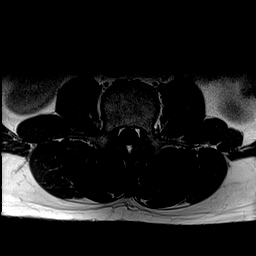
[im 23/32]
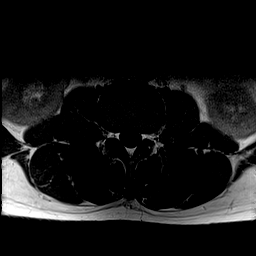
[im 25/32]
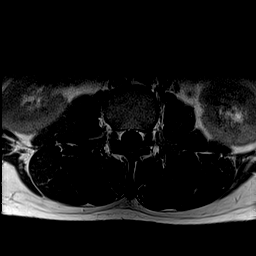
[im 27/32]
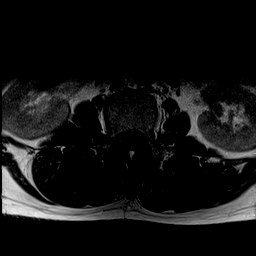
[im 29/32]
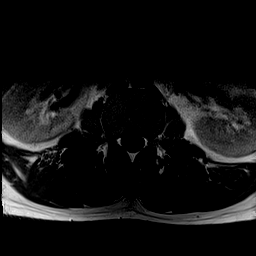
[im 32/32]
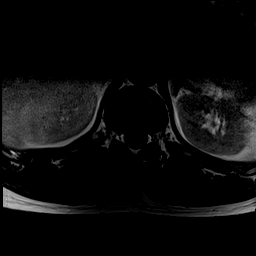

[48 of 48 positions shown; findings below may reference images not displayed]

FINDINGS: Segmentation:  Standard.

Alignment:  Maintained.  Straightening of lordosis noted.

Vertebrae: No fracture or worrisome lesion. Degenerative endplate
signal change L5-S1 is seen.

Conus medullaris: Extends to the T12 level and appears normal.

Paraspinal and other soft tissues: Negative.

Disc levels:

T11-12 is imaged in the sagittal plane only and negative.

T12-L1:  Negative.

L1-2:  Negative.

L2-3:  Negative.

L3-4: Shallow broad-based disc bulge with a more focal protrusion in
the right subarticular recess. The central canal is mildly narrowed.
The patient's protrusion could impact the descending right L4 root.
Foramina are open.

L4-5: Annular fissure and shallow broad-based disc bulge are
identified. There is mild facet degenerative change. There is some
narrowing in the left subarticular recess. The foramina are open.

L5-S1: Broad-based central protrusion contacts the S1 roots without
compression or displacement. The thecal sac is widely patent. Mild
to moderate foraminal narrowing is more notable on the left.
IMPRESSION: Small right subarticular recess protrusion superimposed on a shallow
bulge at L3-4 could impact the descending right L4 root.

Disc and facet degenerative change result in some narrowing in the
left subarticular recess at L4-5 which could irritate the descending
left L5 root.

Shallow broad-based protrusion at L5-S1 contacts the descending S1
roots without compression or displacement.

## 2019-05-03 ENCOUNTER — Other Ambulatory Visit: Payer: Self-pay

## 2019-05-03 DIAGNOSIS — Z20828 Contact with and (suspected) exposure to other viral communicable diseases: Secondary | ICD-10-CM

## 2019-05-03 DIAGNOSIS — Z20822 Contact with and (suspected) exposure to covid-19: Secondary | ICD-10-CM

## 2019-05-05 LAB — NOVEL CORONAVIRUS, NAA: SARS-CoV-2, NAA: NOT DETECTED

## 2019-10-18 ENCOUNTER — Ambulatory Visit: Payer: 59 | Attending: Internal Medicine

## 2019-10-18 DIAGNOSIS — Z20822 Contact with and (suspected) exposure to covid-19: Secondary | ICD-10-CM

## 2019-10-20 LAB — NOVEL CORONAVIRUS, NAA: SARS-CoV-2, NAA: NOT DETECTED

## 2020-10-20 ENCOUNTER — Ambulatory Visit: Payer: Self-pay | Attending: Internal Medicine

## 2020-10-20 DIAGNOSIS — Z23 Encounter for immunization: Secondary | ICD-10-CM

## 2020-10-20 NOTE — Progress Notes (Signed)
   Covid-19 Vaccination Clinic  Name:  Travis Tyler    MRN: [id number] DOB: 18-Mar-1966  10/20/2020  Mr. Mossa was observed post Covid-19 immunization for 15 minutes without incident. He was provided with Vaccine Information Sheet and instruction to access the V-Safe system.   Mr. Helfman was instructed to call 911 with any severe reactions post vaccine: Marland Kitchen Difficulty breathing  . Swelling of face and throat  . A fast heartbeat  . A bad rash all over body  . Dizziness and weakness   Immunizations Administered    Name Date Dose VIS Date Route   Pfizer COVID-19 Vaccine 10/20/2020 11:21 AM 0.3 mL 08/01/2020 Intramuscular   Manufacturer: [employer], [employer]   Lot: [id number]   NDC: [address]-5

## 2022-08-12 ENCOUNTER — Ambulatory Visit (INDEPENDENT_AMBULATORY_CARE_PROVIDER_SITE_OTHER): Payer: Self-pay | Admitting: Family Medicine

## 2022-08-12 ENCOUNTER — Encounter: Payer: Self-pay | Admitting: Family Medicine

## 2022-08-12 VITALS — BP 136/86 | HR 52 | Ht 72.0 in | Wt 246.8 lb

## 2022-08-12 DIAGNOSIS — M5416 Radiculopathy, lumbar region: Secondary | ICD-10-CM

## 2022-08-12 MED ORDER — PREDNISONE 20 MG PO TABS: ORAL_TABLET | ORAL | 0 refills | Status: DC

## 2022-08-12 NOTE — Progress Notes (Signed)
Subjective:    Patient ID: Travis Tyler, male    DOB: 1966/03/28, 55 y.o.   MRN: [id number]  Leg Pain     I last saw the patient in 2018 for sciatica of the right leg.  MRI showed L3-L4 right side lumbar radiculopathy and he received cortisone injections for that at that time.  He states that for the last few weeks he has been having aching pain radiating down his right leg primarily in his anterior right shin from his knee to his current.  It is a constant aching pain.  He denies any numbness or burning or tingling.  He denies any leg weakness.  He does have some right-sided low back pain.  He has excellent pulses of the dorsalis pedis and posterior tibialis bilaterally.  He has excellent strength in both legs and normal reflexes in both legs.  There is no visible rash and the bones in his leg are nontender to palpation there is no palpable lesion. No past medical history on file. No past surgical history on file. No current outpatient medications on file prior to visit.   No current facility-administered medications on file prior to visit.   No Known Allergies Social History   Socioeconomic History   Marital status: Married    Spouse name: Not on file   Number of children: Not on file   Years of education: Not on file   Highest education level: Not on file  Occupational History   Not on file  Tobacco Use   Smoking status: Former   Smokeless tobacco: Never   Tobacco comments:    Uses e-cig  Substance and Sexual Activity   Alcohol use: Yes    Alcohol/week: 0.0 standard drinks of alcohol   Drug use: No   Sexual activity: Yes    Birth control/protection: None  Other Topics Concern   Not on file  Social History Narrative   Not on file   Social Determinants of Health   Financial Resource Strain: Not on file  Food Insecurity: Not on file  Transportation Needs: Not on file  Physical Activity: Not on file  Stress: Not on file  Social Connections: Not on file   Intimate Partner Violence: Not on file     Review of Systems  All other systems reviewed and are negative.      Objective:   Physical Exam Vitals reviewed.  Constitutional:      Appearance: Normal appearance.  Cardiovascular:     Rate and Rhythm: Normal rate and regular rhythm.     Pulses:          Dorsalis pedis pulses are 2+ on the right side and 2+ on the left side.       Posterior tibial pulses are 2+ on the right side and 2+ on the left side.     Heart sounds: Normal heart sounds.  Pulmonary:     Effort: Pulmonary effort is normal.     Breath sounds: Normal breath sounds.  Musculoskeletal:        General: No swelling, tenderness, deformity or signs of injury. Normal range of motion.     Right lower leg: No edema.     Left lower leg: No edema.       Legs:  Neurological:     Mental Status: He is alert.           Assessment & Plan:  Lumbar radiculopathy Pain seems to be following the L5 dermatome in the superficial  peroneal nerve.  Begin prednisone taper pack and if pain is not improving, would recommend repeat imaging of the lumbar spine.  I see no evidence of a blood clot, peripheral vascular disease, cellulitis, or palpable deformity

## 2022-10-16 ENCOUNTER — Ambulatory Visit (INDEPENDENT_AMBULATORY_CARE_PROVIDER_SITE_OTHER): Payer: Managed Care, Other (non HMO) | Admitting: Family Medicine

## 2022-10-16 ENCOUNTER — Encounter: Payer: Self-pay | Admitting: Family Medicine

## 2022-10-16 VITALS — BP 132/80 | HR 75 | Ht 72.0 in | Wt 258.6 lb

## 2022-10-16 DIAGNOSIS — Z13220 Encounter for screening for lipoid disorders: Secondary | ICD-10-CM

## 2022-10-16 DIAGNOSIS — Z1211 Encounter for screening for malignant neoplasm of colon: Secondary | ICD-10-CM

## 2022-10-16 DIAGNOSIS — Z125 Encounter for screening for malignant neoplasm of prostate: Secondary | ICD-10-CM

## 2022-10-16 DIAGNOSIS — Z0000 Encounter for general adult medical examination without abnormal findings: Secondary | ICD-10-CM

## 2022-10-16 DIAGNOSIS — M5431 Sciatica, right side: Secondary | ICD-10-CM

## 2022-10-16 DIAGNOSIS — Z0001 Encounter for general adult medical examination with abnormal findings: Secondary | ICD-10-CM | POA: Diagnosis not present

## 2022-10-16 MED ORDER — MELOXICAM 15 MG PO TABS: 15.0000 mg | ORAL_TABLET | Freq: Every day | ORAL | 0 refills | Status: DC

## 2022-10-16 NOTE — Progress Notes (Signed)
Subjective:    Patient ID: Travis Tyler, male    DOB: 28-Nov-1965, 57 y.o.   MRN: [id number]  Patient is here today for complete physical exam.  He continues to have pain in his right leg.  The pain is neuropathic in nature.  He states that it aches and throbs when he stands up for a long period of time.  It also aches and throbs at night when he is trying to sleep.  The pain is primarily located between his knee and his ankle over the lateral shin.  There is no palpable deformity in this area.  There is no swelling or erythema or bruising or rash.  He has a history of lumbar radiculopathy in 2018.  Pain previously improved with physical therapy.  He is due for a flu shot, shingles shot, and a COVID booster.  He is due for colonoscopy and prostate cancer screening. No past medical history on file. No past surgical history on file. No current outpatient medications on file prior to visit.   No current facility-administered medications on file prior to visit.   No Known Allergies Social History   Socioeconomic History   Marital status: Married    Spouse name: Not on file   Number of children: Not on file   Years of education: Not on file   Highest education level: Not on file  Occupational History   Not on file  Tobacco Use   Smoking status: Former   Smokeless tobacco: Never   Tobacco comments:    Uses e-cig  Substance and Sexual Activity   Alcohol use: Yes    Alcohol/week: 0.0 standard drinks of alcohol   Drug use: No   Sexual activity: Yes    Birth control/protection: None  Other Topics Concern   Not on file  Social History Narrative   Not on file   Social Determinants of Health   Financial Resource Strain: Not on file  Food Insecurity: Not on file  Transportation Needs: Not on file  Physical Activity: Not on file  Stress: Not on file  Social Connections: Not on file  Intimate Partner Violence: Not on file     Review of Systems  All other systems reviewed  and are negative.      Objective:   Physical Exam Vitals reviewed.  Constitutional:      General: He is not in acute distress.    Appearance: Normal appearance. He is not ill-appearing, toxic-appearing or diaphoretic.  HENT:     Head: Normocephalic and atraumatic.     Right Ear: Tympanic membrane and ear canal normal.     Left Ear: Tympanic membrane and ear canal normal.     Nose: No congestion or rhinorrhea.     Mouth/Throat:     Mouth: Mucous membranes are moist.     Pharynx: Oropharynx is clear. No oropharyngeal exudate or posterior oropharyngeal erythema.  Eyes:     General: No scleral icterus.       Right eye: No discharge.        Left eye: No discharge.     Extraocular Movements: Extraocular movements intact.     Conjunctiva/sclera: Conjunctivae normal.     Pupils: Pupils are equal, round, and reactive to light.  Neck:     Vascular: No carotid bruit.  Cardiovascular:     Rate and Rhythm: Normal rate and regular rhythm.     Pulses: Normal pulses.          Dorsalis pedis pulses  are 2+ on the right side and 2+ on the left side.       Posterior tibial pulses are 2+ on the right side and 2+ on the left side.     Heart sounds: Normal heart sounds. No murmur heard. Pulmonary:     Effort: Pulmonary effort is normal. No respiratory distress.     Breath sounds: Normal breath sounds. No wheezing, rhonchi or rales.  Abdominal:     General: Abdomen is flat. Bowel sounds are normal. There is no distension.     Palpations: Abdomen is soft.     Tenderness: There is no abdominal tenderness. There is no guarding or rebound.  Musculoskeletal:        General: No swelling, tenderness, deformity or signs of injury. Normal range of motion.     Cervical back: Neck supple. No rigidity or tenderness.     Right lower leg: No edema.     Left lower leg: No edema.  Lymphadenopathy:     Cervical: No cervical adenopathy.  Skin:    Coloration: Skin is not jaundiced.     Findings: No erythema,  lesion or rash.  Neurological:     General: No focal deficit present.     Mental Status: He is alert and oriented to person, place, and time. Mental status is at baseline.     Cranial Nerves: No cranial nerve deficit.     Sensory: No sensory deficit.     Motor: No weakness.     Coordination: Coordination normal.     Gait: Gait normal.     Deep Tendon Reflexes: Reflexes normal.           Assessment & Plan:  Adult general medical exam - Plan: CBC with Differential/Platelet, Lipid panel, COMPLETE METABOLIC PANEL WITH GFR, PSA  Prostate cancer screening - Plan: PSA  Screening cholesterol level - Plan: CBC with Differential/Platelet, Lipid panel, COMPLETE METABOLIC PANEL WITH GFR  Colon cancer screening - Plan: Ambulatory referral to Gastroenterology  Right sided sciatica - Plan: Ambulatory referral to Physical Therapy, DG Tibia/Fibula Right Physical exam today is normal.  Schedule the patient for colonoscopy to complete colon cancer screening.  Check a PSA to screen for prostate cancer.  Recommended that he quit smoking cigars.  Check CBC CMP and lipid panel.  Goal LDL cholesterol is less than 140 based on the physical therapy for sciatica.  Given the atypical nature of the pain I will get an x-ray of the tibia and fibula in that area to rule out any skeletal related neoplasm.

## 2022-10-21 ENCOUNTER — Other Ambulatory Visit: Payer: Managed Care, Other (non HMO)

## 2022-10-21 LAB — CBC WITH DIFFERENTIAL/PLATELET
Eosinophils Absolute: 164 cells/uL (ref 15–500)
Eosinophils Relative: 4.1 %
HCT: 44.2 % (ref 38.5–50.0)
Lymphs Abs: 1672 cells/uL (ref 850–3900)
MCH: 32.3 pg (ref 27.0–33.0)
MCV: 93.4 fL (ref 80.0–100.0)
Neutro Abs: 1872 cells/uL (ref 1500–7800)
RDW: 12.7 % (ref 11.0–15.0)
WBC: 4 10*3/uL (ref 3.8–10.8)

## 2022-10-22 LAB — COMPLETE METABOLIC PANEL WITH GFR
AG Ratio: 1.7 (calc) (ref 1.0–2.5)
ALT: 19 U/L (ref 9–46)
AST: 16 U/L (ref 10–35)
Albumin: 4.3 g/dL (ref 3.6–5.1)
Alkaline phosphatase (APISO): 70 U/L (ref 35–144)
BUN: 15 mg/dL (ref 7–25)
CO2: 26 mmol/L (ref 20–32)
Calcium: 9.4 mg/dL (ref 8.6–10.3)
Chloride: 109 mmol/L (ref 98–110)
Creat: 1.17 mg/dL (ref 0.70–1.30)
Globulin: 2.5 g/dL (calc) (ref 1.9–3.7)
Glucose, Bld: 84 mg/dL (ref 65–99)
Potassium: 4.6 mmol/L (ref 3.5–5.3)
Sodium: 143 mmol/L (ref 135–146)
Total Bilirubin: 0.6 mg/dL (ref 0.2–1.2)
Total Protein: 6.8 g/dL (ref 6.1–8.1)
eGFR: 74 mL/min/{1.73_m2} (ref >=60)

## 2022-10-22 LAB — CBC WITH DIFFERENTIAL/PLATELET
Absolute Monocytes: 272 cells/uL (ref 200–950)
Basophils Absolute: 20 cells/uL (ref 0–200)
Basophils Relative: 0.5 %
Hemoglobin: 15.3 g/dL (ref 13.2–17.1)
MCHC: 34.6 g/dL (ref 32.0–36.0)
MPV: 10.3 fL (ref 7.5–12.5)
Monocytes Relative: 6.8 %
Neutrophils Relative %: 46.8 %
Platelets: 256 10*3/uL (ref 140–400)
RBC: 4.73 10*6/uL (ref 4.20–5.80)
Total Lymphocyte: 41.8 %

## 2022-10-22 LAB — LIPID PANEL
Cholesterol: 250 mg/dL — ABNORMAL HIGH (ref <200)
HDL: 43 mg/dL (ref >=40)
LDL Cholesterol (Calc): 178 mg/dL (calc) — ABNORMAL HIGH
Non-HDL Cholesterol (Calc): 207 mg/dL (calc) — ABNORMAL HIGH (ref <130)
Total CHOL/HDL Ratio: 5.8 (calc) — ABNORMAL HIGH (ref <5.0)
Triglycerides: 148 mg/dL (ref <150)

## 2022-10-22 LAB — PSA: PSA: 0.35 ng/mL (ref <=4.00)

## 2022-10-23 ENCOUNTER — Other Ambulatory Visit: Payer: Self-pay

## 2022-10-23 DIAGNOSIS — E7800 Pure hypercholesterolemia, unspecified: Secondary | ICD-10-CM

## 2022-10-23 MED ORDER — ROSUVASTATIN CALCIUM 10 MG PO TABS: 10.0000 mg | ORAL_TABLET | Freq: Every day | ORAL | 3 refills | Status: DC

## 2022-10-29 ENCOUNTER — Ambulatory Visit: Payer: Managed Care, Other (non HMO) | Admitting: Physical Therapy

## 2022-10-29 NOTE — Therapy (Incomplete)
OUTPATIENT PHYSICAL THERAPY THORACOLUMBAR EVALUATION   Patient Name: Travis Tyler MRN: [id number] DOB:[birth year], 57 y.o., male Today's Date: 1/17/2024o., male Today's Date: 10/29/2022  END OF SESSION:   No past medical history on file. No past surgical history on file. There are no problems to display for this patient.   PCP: Susy Frizzle, MD  REFERRING PROVIDER: Susy Frizzle, MD  REFERRING DIAG: M54.31 (ICD-10-CM) - Right sided sciatica  Rationale for Evaluation and Treatment: Rehabilitation  THERAPY DIAG:  No diagnosis found.  ONSET DATE: ***  SUBJECTIVE:                                                                                                                                                                                           SUBJECTIVE STATEMENT: ***  PERTINENT HISTORY:  HLD, hx sciatica  PAIN:  Are you having pain? Yes: NPRS scale: ***/10 Pain location: *** Pain description: *** Aggravating factors: *** Relieving factors: ***  PRECAUTIONS: None  WEIGHT BEARING RESTRICTIONS: No  FALLS:  Has patient fallen in last 6 months? {fallsyesno:27318}  LIVING ENVIRONMENT: Lives with: {[facility] lives with:25569::"lives with their family"} Lives in: {Lives in:25570} Stairs: {opstairs:27293} Has following equipment at home: {Assistive devices:23999}  OCCUPATION: ***  PLOF: {PLOF:24004}  PATIENT GOALS: ***  NEXT MD VISIT: 01/22/23  OBJECTIVE:   DIAGNOSTIC FINDINGS:  None  PATIENT SURVEYS:  10/29/22:FOTO ***  SCREENING FOR RED FLAGS: Bowel or bladder incontinence: {WCH/EN:277824235} Spinal tumors: {Yes/No:304960894} Cauda equina syndrome: {Yes/No:304960894} Compression fracture: {Yes/No:304960894} Abdominal aneurysm: {Yes/No:304960894}  COGNITION: Overall cognitive status: {cognition:24006}     SENSATION: 10/29/22: {sensation:27233}  MUSCLE LENGTH: 10/29/22:  Hamstrings: Right *** deg; Left *** deg Marcello Moores test: Right *** deg; Left *** deg  POSTURE:   10/29/22 {posture:25561}  PALPATION: 10/29/22 ***  LUMBAR ROM:   AROM eval  Flexion   Extension   Right lateral flexion   Left lateral flexion   Right rotation   Left rotation    (Blank rows = not tested)  LOWER EXTREMITY ROM:     {AROM/PROM:27142}  Right eval Left eval  Hip flexion    Hip extension    Hip abduction    Hip adduction    Hip internal rotation    Hip external rotation    Knee flexion    Knee extension    Ankle dorsiflexion    Ankle plantarflexion    Ankle inversion    Ankle eversion     (Blank rows = not tested)  LOWER EXTREMITY MMT:    MMT Right eval Left eval  Hip flexion    Hip extension    Hip abduction    Hip adduction  Hip internal rotation    Hip external rotation    Knee flexion    Knee extension    Ankle dorsiflexion    Ankle plantarflexion    Ankle inversion    Ankle eversion     (Blank rows = not tested)  LUMBAR SPECIAL TESTS:  10/29/22 {lumbar special test:25242}  FUNCTIONAL TESTS:  10/29/22: {Functional tests:24029}  GAIT: 10/29/22: Distance walked: *** Assistive device utilized: {Assistive devices:23999} Level of assistance: {Levels of assistance:24026} Comments: ***  TODAY'S TREATMENT:                                                                                                                              DATE: 10/29/22 ***    PATIENT EDUCATION:  Education details: HEP Person educated: Patient Education method: Consulting civil engineer, Media planner, and Handouts Education comprehension: verbalized understanding, returned demonstration, and needs further education  HOME EXERCISE PROGRAM: ***  ASSESSMENT:  CLINICAL IMPRESSION: Patient is a 57 y.o. male who was seen today for physical therapy evaluation and treatment for Rt sided LBP.  He demonstrates *** affecting functional mobility.  He will benefit from PT to address deficits listed.   OBJECTIVE IMPAIRMENTS: {opptimpairments:25111}.   ACTIVITY LIMITATIONS:  {activitylimitations:27494}  PARTICIPATION LIMITATIONS: {participationrestrictions:25113}  PERSONAL FACTORS: {Personal factors:25162} are also affecting patient's functional outcome.   REHAB POTENTIAL: {rehabpotential:25112}  CLINICAL DECISION MAKING: {clinical decision making:25114}  EVALUATION COMPLEXITY: {Evaluation complexity:25115}   GOALS: Goals reviewed with patient? Yes  SHORT TERM GOALS: Target date: ***  Independent with initial HEP Goal status: INITIAL  2.  *** Goal status: {GOALSTATUS:25110}  3.  *** Goal status: {GOALSTATUS:25110}   LONG TERM GOALS: Target date: ***  Independent with final HEP Goal status: INITIAL  2.  FOTO score improved to *** Goal status: INITIAL  3.  *** improved to *** for *** Goal status: INIITAL  4.  Report pain < ***/10 with *** for improved function Goal status: INITIAL  5.  *** Goal status: {GOALSTATUS:25110}  6.  *** Goal status: {GOALSTATUS:25110}    PLAN:  PT FREQUENCY: {rehab frequency:25116}  PT DURATION: {rehab duration:25117}  PLANNED INTERVENTIONS: {rehab planned interventions:25118::"Therapeutic exercises","Therapeutic activity","Neuromuscular re-education","Balance training","Gait training","Patient/Family education","Self Care","Joint mobilization"}.  PLAN FOR NEXT SESSION: ***   Laureen Abrahams, PT, DPT 10/29/22 12:51 PM

## 2022-11-05 ENCOUNTER — Other Ambulatory Visit: Payer: Self-pay

## 2022-11-05 ENCOUNTER — Ambulatory Visit: Payer: Managed Care, Other (non HMO) | Admitting: Rehabilitative and Restorative Service Providers"

## 2022-11-05 ENCOUNTER — Encounter: Payer: Self-pay | Admitting: Rehabilitative and Restorative Service Providers"

## 2022-11-05 DIAGNOSIS — M79604 Pain in right leg: Secondary | ICD-10-CM | POA: Diagnosis not present

## 2022-11-05 DIAGNOSIS — R262 Difficulty in walking, not elsewhere classified: Secondary | ICD-10-CM | POA: Diagnosis not present

## 2022-11-05 DIAGNOSIS — M5416 Radiculopathy, lumbar region: Secondary | ICD-10-CM | POA: Diagnosis not present

## 2022-11-05 NOTE — Therapy (Addendum)
OUTPATIENT PHYSICAL THERAPY EVALUATION /DISCHARGE   Patient Name: Travis Tyler MRN: [id number] DOB:13-Jun-1966, 57 y.o., male Today's Date: 11/05/2022  END OF SESSION:  PT End of Session - 11/05/22 1525     Visit Number 1    Number of Visits 20    Date for PT Re-Evaluation 01/14/23    Authorization Type [employer] $50 copay- 30 visits    PT Start Time 1526    PT Stop Time 1555    PT Time Calculation (min) 29 min    Activity Tolerance Patient tolerated treatment well    Behavior During Therapy [facility] for tasks assessed/performed             History reviewed. No pertinent past medical history. History reviewed. No pertinent surgical history. There are no problems to display for this patient.   PCP: Susy Frizzle, MD  REFERRING PROVIDER: Susy Frizzle, MD  REFERRING DIAG: M54.31 (ICD-10-CM) - Right sided sciatica  Rationale for Evaluation and Treatment: Rehabilitation  THERAPY DIAG:  Pain in right leg  Radiculopathy, lumbar region  Difficulty walking  ONSET DATE: Sept 2023  SUBJECTIVE:                                                                                                                                                                                           SUBJECTIVE STATEMENT: He indicated complaints of sharp pain down outside of Rt leg, lower noted.  He indicated history of complaints around 2019 when he was doing some leg curls.  He had therapy then with some improvement.  He indicated he was giving some medicine to take that seemed to help.   He indicated he was moving at time of the recent worsening of symptoms.  He reported some trouble sleeping due to symptoms.   He indicated worse during work days.   PERTINENT HISTORY:  Past history of symptoms.   PAIN:  NPRS scale: current 6/10, at worst 10/10 Pain location: Rt lower leg lateral Pain description: sharp, numbness Rt foot at times.  Aggravating factors: prolonged driving, prolonged  standing, prolonged walking Relieving factors: Medicine, OTC medicine  PRECAUTIONS: None  WEIGHT BEARING RESTRICTIONS: No  FALLS:  Has patient fallen in last 6 months? 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    PLOF: Independent, previously like to work out   PATIENT GOALS:  Reduce pain   OBJECTIVE:   PATIENT SURVEYS:  11/05/2022 FOTO eval:  60  predicted:  63  SCREENING FOR RED FLAGS: 11/05/2022 Bowel  or bladder incontinence: No Cauda equina syndrome: No  COGNITION: 11/05/2022 Overall cognitive status: WFL normal      SENSATION: 11/05/2022 No specific testing performed.   MUSCLE LENGTH: 11/05/2022 No specific testing  POSTURE:  11/05/2022 Lt lateral shift correction noted in standing.  Correction reduced leg symptoms.   PALPATION: 11/05/2022 No specific tenderness noted.   LUMBAR ROM:   AROM 11/05/2022  Flexion Not tested  Extension 50 % c tightness, reduced Rt leg symptoms.  Centralization noted.    Prone press up produced similar results.   Right lateral flexion   Left lateral flexion   Right rotation   Left rotation    (Blank rows = not tested)  LOWER EXTREMITY ROM:      Right 11/05/2022 Left 11/05/2022  Hip flexion    Hip extension    Hip abduction    Hip adduction    Hip internal rotation    Hip external rotation    Knee flexion    Knee extension    Ankle dorsiflexion    Ankle plantarflexion    Ankle inversion    Ankle eversion     (Blank rows = not tested)  LOWER EXTREMITY MMT:    MMT Right 11/05/2022 Left 11/05/2022  Hip flexion    Hip extension    Hip abduction    Hip adduction    Hip internal rotation    Hip external rotation    Knee flexion    Knee extension    Ankle dorsiflexion    Ankle plantarflexion    Ankle inversion    Ankle eversion     (Blank rows = not tested)  LUMBAR SPECIAL TESTS:  11/05/2022 No  specific testing performed.   FUNCTIONAL TESTS:  11/05/2022 No specific testing.   GAIT: 11/05/2022 Independent ambulation into clinic and in clinic.   TODAY'S TREATMENT:                                                                                                      DATE: 11/05/2022  Therex:    HEP instruction/performance c cues for techniques, handout provided.  Trial set performed of each for comprehension and symptom assessment.  See below for exercise list  PATIENT EDUCATION:  11/05/2022 Education details: HEP, POC Person educated: Patient Education method: Explanation, Demonstration, Verbal cues, and Handouts Education comprehension: verbalized understanding, returned demonstration, and verbal cues required  HOME EXERCISE PROGRAM: Access Code: [email or web address] URL: [email or web address] Date: 11/05/2022 Prepared by: Scot Jun  Exercises - Standing Lumbar Extension with Counter  - 3-5 x daily - 7 x weekly - 1 sets - 5-10 reps - Prone Press Up  - 2-3 x daily - 7 x weekly - 1-2 sets - 10 reps - 1-2 hold - Left Standing Lateral Shift Correction at Wall - Repetitions  - 2-3 x daily - 7 x weekly - 1 sets - 5-10 reps - 3-5 hold  ASSESSMENT:  CLINICAL IMPRESSION: Patient is a 57 y.o. who comes to clinic with complaints of Rt lower leg pain lumbar radicular related c direction specific preference for extension c a  left lateral shift noted with mobility deficits that impair their ability to perform usual daily and recreational functional activities without increase difficulty/symptoms at this time.  Patient to benefit from skilled PT services to address impairments and limitations to improve to previous level of function without restriction secondary to condition.   OBJECTIVE IMPAIRMENTS: Abnormal gait, decreased activity tolerance, decreased coordination, decreased endurance, decreased mobility, difficulty walking, decreased ROM, hypomobility, increased fascial  restrictions, impaired perceived functional ability, impaired flexibility, improper body mechanics, postural dysfunction, and pain.   ACTIVITY LIMITATIONS: carrying, lifting, bending, sitting, standing, transfers, and locomotion level  PARTICIPATION LIMITATIONS: interpersonal relationship, driving, shopping, community activity, and occupation  PERSONAL FACTORS: Time since onset of injury/illness/exacerbation are also affecting patient's functional outcome.   REHAB POTENTIAL: Good  CLINICAL DECISION MAKING: Stable/uncomplicated  EVALUATION COMPLEXITY: Low   GOALS: Goals reviewed with patient? Yes  SHORT TERM GOALS: (target date for Short term goals are 3 weeks 11/26/2022)  1. Patient will demonstrate independent use of home exercise program to maintain progress from in clinic treatments.  Goal status: New  LONG TERM GOALS: (target dates for all long term goals are 10 weeks  01/14/2023 )   1. Patient will demonstrate/report pain at worst less than or equal to 2/10 to facilitate minimal limitation in daily activity secondary to pain symptoms.  Goal status: New   2. Patient will demonstrate independent use of home exercise program to facilitate ability to maintain/progress functional gains from skilled physical therapy services.  Goal status: New   3. Patient will demonstrate FOTO outcome > or = 63 % to indicate reduced disability due to condition.  Goal status: New   4. Patient will demonstrate lumbar extension 100 % WFL s symptoms to facilitate upright standing, walking posture at PLOF s limitation.  Goal status: New   5. Patient will demonstrate corrected lateral shift in standing posture for normal standing/walking positioning.  Goal status: New   6.  Patient will demonstrate ability to work and sleep s restriction.  Goal status: New     PLAN:  PT FREQUENCY: 1-2x/week  PT DURATION: 10 weeks  PLANNED INTERVENTIONS: Therapeutic exercises, Therapeutic activity, Neuro  Muscular re-education, Balance training, Gait training, Patient/Family education, Joint mobilization, Stair training, DME instructions, Dry Needling, Electrical stimulation, Cryotherapy, vasopneumatic device, Moist heat, Taping, Traction Ultrasound, Ionotophoresis 4mg /ml Dexamethasone, and Manual therapy.  All included unless contraindicated  PLAN FOR NEXT SESSION: Review HEP knowledge/results.  Check lateral shift and continue work with direction specific response to extension if still favorable.    Scot Jun, PT, DPT, OCS, ATC 11/05/22  4:04 PM   PHYSICAL THERAPY DISCHARGE SUMMARY  Visits from Start of Care: 1  Current functional level related to goals / functional outcomes: See note   Remaining deficits: See note   Education / Equipment: HEP  Patient goals were not met. Patient is being discharged due to not returning since the last visit.  Scot Jun, PT, DPT, OCS, ATC 01/08/23  1:36 PM

## 2022-11-14 ENCOUNTER — Other Ambulatory Visit: Payer: Self-pay | Admitting: Family Medicine

## 2022-11-14 NOTE — Telephone Encounter (Signed)
Requested Prescriptions  Pending Prescriptions Disp Refills   meloxicam (MOBIC) 15 MG tablet [Pharmacy Med Name: MELOXICAM 15MG  TABLETS] 30 tablet 0    Sig: TAKE 1 TABLET(15 MG) BY MOUTH DAILY     Analgesics:  COX2 Inhibitors Failed - 11/14/2022  3:55 AM      Failed - Manual Review: Labs are only required if the patient has taken medication for more than 8 weeks.      Failed - Valid encounter within last 12 months    Recent Outpatient Visits           6 years ago Routine general medical examination at a health care facility   [place], Cammie Mcgee, MD   6 years ago Acute bilateral low back pain with right-sided sciatica   [place], Mary B, PA-C   7 years ago Routine general medical examination at a health care facility   [place], Cammie Mcgee, MD   8 years ago Routine general medical examination at a health care facility   [place], Cammie Mcgee, MD   9 years ago Conjunctivitis   [facility], Renette Butters, MD              Passed - HGB in normal range and within 360 days    Hemoglobin  Date Value Ref Range Status  10/21/2022 15.3 13.2 - 17.1 g/dL Final         Passed - Cr in normal range and within 360 days    Creat  Date Value Ref Range Status  10/21/2022 1.17 0.70 - 1.30 mg/dL Final         Passed - HCT in normal range and within 360 days    HCT  Date Value Ref Range Status  10/21/2022 44.2 38.5 - 50.0 % Final         Passed - AST in normal range and within 360 days    AST  Date Value Ref Range Status  10/21/2022 16 10 - 35 U/L Final         Passed - ALT in normal range and within 360 days    ALT  Date Value Ref Range Status  10/21/2022 19 9 - 46 U/L Final         Passed - eGFR is 30 or above and within 360 days    GFR, Est African American  Date Value Ref Range Status  10/17/2016 >89 >=60 mL/min Final   GFR, Est Non African American  Date  Value Ref Range Status  10/17/2016 89 >=60 mL/min Final   eGFR  Date Value Ref Range Status  10/21/2022 74 > OR = 60 mL/min/1.14m2 Final         Passed - Patient is not pregnant

## 2022-11-18 ENCOUNTER — Encounter: Payer: Managed Care, Other (non HMO) | Admitting: Rehabilitative and Restorative Service Providers"

## 2022-11-18 ENCOUNTER — Telehealth: Payer: Self-pay | Admitting: Rehabilitative and Restorative Service Providers"

## 2022-11-18 NOTE — Telephone Encounter (Signed)
Called patient after 15 mins no show for appointment today. He indicated he couldn't make it.  Reminded of next appointment time.   Scot Jun, PT, DPT, OCS, ATC 11/18/22  4:18 PM

## 2022-11-18 NOTE — Therapy (Incomplete)
OUTPATIENT PHYSICAL THERAPY TREATMENT   Patient Name: Travis Tyler MRN: [id number] DOB:Sep 17, 1966, 57 y.o., male Today's Date: 11/18/2022  END OF SESSION:    No past medical history on file. No past surgical history on file. There are no problems to display for this patient.   PCP: Susy Frizzle, MD  REFERRING PROVIDER: Susy Frizzle, MD  REFERRING DIAG: M54.31 (ICD-10-CM) - Right sided sciatica  Rationale for Evaluation and Treatment: Rehabilitation  THERAPY DIAG:  No diagnosis found.  ONSET DATE: Sept 2023  SUBJECTIVE:                                                                                                                                                                                           SUBJECTIVE STATEMENT: He indicated complaints of sharp pain down outside of Rt leg, lower noted.  He indicated history of complaints around 2019 when he was doing some leg curls.  He had therapy then with some improvement.  He indicated he was giving some medicine to take that seemed to help.   He indicated he was moving at time of the recent worsening of symptoms.  He reported some trouble sleeping due to symptoms.   He indicated worse during work days.   PERTINENT HISTORY:  Past history of symptoms.   PAIN:  NPRS scale: current 6/10, at worst 10/10 Pain location: Rt lower leg lateral Pain description: sharp, numbness Rt foot at times.  Aggravating factors: prolonged driving, prolonged standing, prolonged walking Relieving factors: Medicine, OTC medicine  PRECAUTIONS: None  WEIGHT BEARING RESTRICTIONS: No  FALLS:  Has patient fallen in last 6 months? 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    PLOF: Independent, previously like to work out   PATIENT GOALS:  Reduce pain   OBJECTIVE:   PATIENT SURVEYS:  11/05/2022 FOTO eval:  60   predicted:  63  SCREENING FOR RED FLAGS: 11/05/2022 Bowel or bladder incontinence: No Cauda equina syndrome: No  COGNITION: 11/05/2022 Overall cognitive status: WFL normal      SENSATION: 11/05/2022 No specific testing performed.   MUSCLE LENGTH: 11/05/2022 No specific testing  POSTURE:  11/05/2022 Lt lateral shift correction noted in standing.  Correction reduced leg symptoms.   PALPATION: 11/05/2022 No specific tenderness noted.   LUMBAR ROM:   AROM 11/05/2022 11/18/2022  Flexion Not tested   Extension 50 % c tightness, reduced Rt leg symptoms.  Centralization noted.    Prone press up produced similar results.    Right lateral flexion  Left lateral flexion    Right rotation    Left rotation     (Blank rows = not tested)  LOWER EXTREMITY ROM:      Right 11/05/2022 Left 11/05/2022  Hip flexion    Hip extension    Hip abduction    Hip adduction    Hip internal rotation    Hip external rotation    Knee flexion    Knee extension    Ankle dorsiflexion    Ankle plantarflexion    Ankle inversion    Ankle eversion     (Blank rows = not tested)  LOWER EXTREMITY MMT:    MMT Right 11/05/2022 Left 11/05/2022  Hip flexion    Hip extension    Hip abduction    Hip adduction    Hip internal rotation    Hip external rotation    Knee flexion    Knee extension    Ankle dorsiflexion    Ankle plantarflexion    Ankle inversion    Ankle eversion     (Blank rows = not tested)  LUMBAR SPECIAL TESTS:  11/05/2022 No specific testing performed.   FUNCTIONAL TESTS:  11/05/2022 No specific testing.   GAIT: 11/05/2022 Independent ambulation into clinic and in clinic.   TODAY'S TREATMENT:                                                                                         DATE: 11/18/2022  Therex:       TODAY'S TREATMENT:                                                                                         DATE: 11/05/2022  Therex:    HEP  instruction/performance c cues for techniques, handout provided.  Trial set performed of each for comprehension and symptom assessment.  See below for exercise list  PATIENT EDUCATION:  11/05/2022 Education details: HEP, POC Person educated: Patient Education method: Explanation, Demonstration, Verbal cues, and Handouts Education comprehension: verbalized understanding, returned demonstration, and verbal cues required  HOME EXERCISE PROGRAM: Access Code: [email or web address] URL: [email or web address] Date: 11/05/2022 Prepared by: Scot Jun  Exercises - Standing Lumbar Extension with Counter  - 3-5 x daily - 7 x weekly - 1 sets - 5-10 reps - Prone Press Up  - 2-3 x daily - 7 x weekly - 1-2 sets - 10 reps - 1-2 hold - Left Standing Lateral Shift Correction at Wall - Repetitions  - 2-3 x daily - 7 x weekly - 1 sets - 5-10 reps - 3-5 hold  ASSESSMENT:  CLINICAL IMPRESSION: Patient is a 57 y.o. who comes to clinic with complaints of Rt lower leg pain lumbar radicular related c direction specific preference for extension c a left lateral shift noted with mobility deficits that impair their  ability to perform usual daily and recreational functional activities without increase difficulty/symptoms at this time.  Patient to benefit from skilled PT services to address impairments and limitations to improve to previous level of function without restriction secondary to condition.   OBJECTIVE IMPAIRMENTS: Abnormal gait, decreased activity tolerance, decreased coordination, decreased endurance, decreased mobility, difficulty walking, decreased ROM, hypomobility, increased fascial restrictions, impaired perceived functional ability, impaired flexibility, improper body mechanics, postural dysfunction, and pain.   ACTIVITY LIMITATIONS: carrying, lifting, bending, sitting, standing, transfers, and locomotion level  PARTICIPATION LIMITATIONS: interpersonal relationship, driving, shopping, community  activity, and occupation  PERSONAL FACTORS: Time since onset of injury/illness/exacerbation are also affecting patient's functional outcome.   REHAB POTENTIAL: Good  CLINICAL DECISION MAKING: Stable/uncomplicated  EVALUATION COMPLEXITY: Low   GOALS: Goals reviewed with patient? Yes  SHORT TERM GOALS: (target date for Short term goals are 3 weeks 11/26/2022)  1. Patient will demonstrate independent use of home exercise program to maintain progress from in clinic treatments.  Goal status: New  LONG TERM GOALS: (target dates for all long term goals are 10 weeks  01/14/2023 )   1. Patient will demonstrate/report pain at worst less than or equal to 2/10 to facilitate minimal limitation in daily activity secondary to pain symptoms.  Goal status: New   2. Patient will demonstrate independent use of home exercise program to facilitate ability to maintain/progress functional gains from skilled physical therapy services.  Goal status: New   3. Patient will demonstrate FOTO outcome > or = 63 % to indicate reduced disability due to condition.  Goal status: New   4. Patient will demonstrate lumbar extension 100 % WFL s symptoms to facilitate upright standing, walking posture at PLOF s limitation.  Goal status: New   5. Patient will demonstrate corrected lateral shift in standing posture for normal standing/walking positioning.  Goal status: New   6.  Patient will demonstrate ability to work and sleep s restriction.  Goal status: New     PLAN:  PT FREQUENCY: 1-2x/week  PT DURATION: 10 weeks  PLANNED INTERVENTIONS: Therapeutic exercises, Therapeutic activity, Neuro Muscular re-education, Balance training, Gait training, Patient/Family education, Joint mobilization, Stair training, DME instructions, Dry Needling, Electrical stimulation, Cryotherapy, vasopneumatic device, Moist heat, Taping, Traction Ultrasound, Ionotophoresis 4mg /ml Dexamethasone, and Manual therapy.  All included  unless contraindicated  PLAN FOR NEXT SESSION: Review HEP knowledge/results.  Check lateral shift and continue work with direction specific response to extension if still favorable.    Scot Jun, PT, DPT, OCS, ATC 11/18/22  1:50 PM

## 2022-11-25 ENCOUNTER — Encounter: Payer: Managed Care, Other (non HMO) | Admitting: Rehabilitative and Restorative Service Providers"

## 2022-11-25 NOTE — Therapy (Incomplete)
OUTPATIENT PHYSICAL THERAPY TREATMENT   Patient Name: Travis Tyler MRN: [id number] DOB:[birth year], 57 y.o., male Today's Date: 11/25/2022  END OF SESSION:    No past medical history on file. No past surgical history on file. There are no problems to display for this patient.   PCP: Susy Frizzle, MD  REFERRING PROVIDER: Susy Frizzle, MD  REFERRING DIAG: M54.31 (ICD-10-CM) - Right sided sciatica  Rationale for Evaluation and Treatment: Rehabilitation  THERAPY DIAG:  No diagnosis found.  ONSET DATE: Sept 2023  SUBJECTIVE:                                                                                                                                                                                           SUBJECTIVE STATEMENT: He indicated complaints of sharp pain down outside of Rt leg, lower noted.  He indicated history of complaints around 2019 when he was doing some leg curls.  He had therapy then with some improvement.  He indicated he was giving some medicine to take that seemed to help.   He indicated he was moving at time of the recent worsening of symptoms.  He reported some trouble sleeping due to symptoms.   He indicated worse during work days.   PERTINENT HISTORY:  Past history of symptoms.   PAIN:  NPRS scale: current 6/10, at worst 10/10 Pain location: Rt lower leg lateral Pain description: sharp, numbness Rt foot at times.  Aggravating factors: prolonged driving, prolonged standing, prolonged walking Relieving factors: Medicine, OTC medicine  PRECAUTIONS: None  WEIGHT BEARING RESTRICTIONS: No  FALLS:  Has patient fallen in last 6 months? 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    PLOF: Independent, previously like to work out   PATIENT GOALS:  Reduce pain   OBJECTIVE:   PATIENT SURVEYS:  11/05/2022 FOTO eval:  60   predicted:  63  SCREENING FOR RED FLAGS: 11/05/2022 Bowel or bladder incontinence: No Cauda equina syndrome: No  COGNITION: 11/05/2022 Overall cognitive status: WFL normal      SENSATION: 11/05/2022 No specific testing performed.   MUSCLE LENGTH: 11/05/2022 No specific testing  POSTURE:  11/05/2022 Lt lateral shift correction noted in standing.  Correction reduced leg symptoms.   PALPATION: 11/05/2022 No specific tenderness noted.   LUMBAR ROM:   AROM 11/05/2022 11/18/2022  Flexion Not tested   Extension 50 % c tightness, reduced Rt leg symptoms.  Centralization noted.    Prone press up produced similar results.    Right lateral flexion  Left lateral flexion    Right rotation    Left rotation     (Blank rows = not tested)  LOWER EXTREMITY ROM:      Right 11/05/2022 Left 11/05/2022  Hip flexion    Hip extension    Hip abduction    Hip adduction    Hip internal rotation    Hip external rotation    Knee flexion    Knee extension    Ankle dorsiflexion    Ankle plantarflexion    Ankle inversion    Ankle eversion     (Blank rows = not tested)  LOWER EXTREMITY MMT:    MMT Right 11/05/2022 Left 11/05/2022  Hip flexion    Hip extension    Hip abduction    Hip adduction    Hip internal rotation    Hip external rotation    Knee flexion    Knee extension    Ankle dorsiflexion    Ankle plantarflexion    Ankle inversion    Ankle eversion     (Blank rows = not tested)  LUMBAR SPECIAL TESTS:  11/05/2022 No specific testing performed.   FUNCTIONAL TESTS:  11/05/2022 No specific testing.   GAIT: 11/05/2022 Independent ambulation into clinic and in clinic.   TODAY'S TREATMENT:                                                                                         DATE: 11/25/2022  Therex:       TODAY'S TREATMENT:                                                                                         DATE: 11/05/2022  Therex:    HEP  instruction/performance c cues for techniques, handout provided.  Trial set performed of each for comprehension and symptom assessment.  See below for exercise list  PATIENT EDUCATION:  11/05/2022 Education details: HEP, POC Person educated: Patient Education method: Explanation, Demonstration, Verbal cues, and Handouts Education comprehension: verbalized understanding, returned demonstration, and verbal cues required  HOME EXERCISE PROGRAM: Access Code: [email or web address] URL: [email or web address] Date: 11/05/2022 Prepared by: Scot Jun  Exercises - Standing Lumbar Extension with Counter  - 3-5 x daily - 7 x weekly - 1 sets - 5-10 reps - Prone Press Up  - 2-3 x daily - 7 x weekly - 1-2 sets - 10 reps - 1-2 hold - Left Standing Lateral Shift Correction at Wall - Repetitions  - 2-3 x daily - 7 x weekly - 1 sets - 5-10 reps - 3-5 hold  ASSESSMENT:  CLINICAL IMPRESSION: Patient is a 57 y.o. who comes to clinic with complaints of Rt lower leg pain lumbar radicular related c direction specific preference for extension c a left lateral shift noted with mobility deficits that impair their  ability to perform usual daily and recreational functional activities without increase difficulty/symptoms at this time.  Patient to benefit from skilled PT services to address impairments and limitations to improve to previous level of function without restriction secondary to condition.   OBJECTIVE IMPAIRMENTS: Abnormal gait, decreased activity tolerance, decreased coordination, decreased endurance, decreased mobility, difficulty walking, decreased ROM, hypomobility, increased fascial restrictions, impaired perceived functional ability, impaired flexibility, improper body mechanics, postural dysfunction, and pain.   ACTIVITY LIMITATIONS: carrying, lifting, bending, sitting, standing, transfers, and locomotion level  PARTICIPATION LIMITATIONS: interpersonal relationship, driving, shopping, community  activity, and occupation  PERSONAL FACTORS: Time since onset of injury/illness/exacerbation are also affecting patient's functional outcome.   REHAB POTENTIAL: Good  CLINICAL DECISION MAKING: Stable/uncomplicated  EVALUATION COMPLEXITY: Low   GOALS: Goals reviewed with patient? Yes  SHORT TERM GOALS: (target date for Short term goals are 3 weeks 11/26/2022)  1. Patient will demonstrate independent use of home exercise program to maintain progress from in clinic treatments.  Goal status: New  LONG TERM GOALS: (target dates for all long term goals are 10 weeks  01/14/2023 )   1. Patient will demonstrate/report pain at worst less than or equal to 2/10 to facilitate minimal limitation in daily activity secondary to pain symptoms.  Goal status: New   2. Patient will demonstrate independent use of home exercise program to facilitate ability to maintain/progress functional gains from skilled physical therapy services.  Goal status: New   3. Patient will demonstrate FOTO outcome > or = 63 % to indicate reduced disability due to condition.  Goal status: New   4. Patient will demonstrate lumbar extension 100 % WFL s symptoms to facilitate upright standing, walking posture at PLOF s limitation.  Goal status: New   5. Patient will demonstrate corrected lateral shift in standing posture for normal standing/walking positioning.  Goal status: New   6.  Patient will demonstrate ability to work and sleep s restriction.  Goal status: New     PLAN:  PT FREQUENCY: 1-2x/week  PT DURATION: 10 weeks  PLANNED INTERVENTIONS: Therapeutic exercises, Therapeutic activity, Neuro Muscular re-education, Balance training, Gait training, Patient/Family education, Joint mobilization, Stair training, DME instructions, Dry Needling, Electrical stimulation, Cryotherapy, vasopneumatic device, Moist heat, Taping, Traction Ultrasound, Ionotophoresis 56m/ml Dexamethasone, and Manual therapy.  All included  unless contraindicated  PLAN FOR NEXT SESSION: Review HEP knowledge/results.  Check lateral shift and continue work with direction specific response to extension if still favorable.    MScot Jun PT, DPT, OCS, ATC 11/25/22  2:01 PM

## 2022-12-02 ENCOUNTER — Encounter: Payer: Managed Care, Other (non HMO) | Admitting: Rehabilitative and Restorative Service Providers"

## 2022-12-02 ENCOUNTER — Telehealth: Payer: Self-pay | Admitting: Rehabilitative and Restorative Service Providers"

## 2022-12-02 NOTE — Telephone Encounter (Signed)
Called after 15 mins no show.  Pt indicated he was out of town for work and couldn't make today's appointment.   Requested cancel of next appointment for same reason.   Scot Jun, PT, DPT, OCS, ATC 12/02/22  4:17 PM

## 2022-12-09 ENCOUNTER — Encounter: Payer: Managed Care, Other (non HMO) | Admitting: Rehabilitative and Restorative Service Providers"

## 2023-01-22 ENCOUNTER — Other Ambulatory Visit: Payer: Managed Care, Other (non HMO)

## 2023-01-22 DIAGNOSIS — E7800 Pure hypercholesterolemia, unspecified: Secondary | ICD-10-CM

## 2023-01-23 LAB — LIPID PANEL
Cholesterol: 193 mg/dL (ref <200)
HDL: 40 mg/dL (ref >=40)
LDL Cholesterol (Calc): 127 mg/dL (calc) — ABNORMAL HIGH
Non-HDL Cholesterol (Calc): 153 mg/dL (calc) — ABNORMAL HIGH (ref <130)
Total CHOL/HDL Ratio: 4.8 (calc) (ref <5.0)
Triglycerides: 151 mg/dL — ABNORMAL HIGH (ref <150)

## 2023-05-29 ENCOUNTER — Other Ambulatory Visit (INDEPENDENT_AMBULATORY_CARE_PROVIDER_SITE_OTHER): Payer: Self-pay | Admitting: Family Medicine

## 2023-07-17 ENCOUNTER — Other Ambulatory Visit: Payer: Self-pay | Admitting: Family Medicine

## 2023-07-17 DIAGNOSIS — Z1211 Encounter for screening for malignant neoplasm of colon: Secondary | ICD-10-CM

## 2023-07-17 DIAGNOSIS — Z1212 Encounter for screening for malignant neoplasm of rectum: Secondary | ICD-10-CM

## 2023-10-09 LAB — COLOGUARD: COLOGUARD: NEGATIVE

## 2023-12-29 ENCOUNTER — Encounter: Payer: Self-pay | Admitting: Family Medicine

## 2023-12-29 ENCOUNTER — Ambulatory Visit (INDEPENDENT_AMBULATORY_CARE_PROVIDER_SITE_OTHER): Payer: Managed Care, Other (non HMO) | Admitting: Family Medicine

## 2023-12-29 VITALS — BP 182/108 | HR 60 | Temp 98.6°F | Resp 16 | Wt 243.0 lb

## 2023-12-29 DIAGNOSIS — Z23 Encounter for immunization: Secondary | ICD-10-CM

## 2023-12-29 DIAGNOSIS — Z0000 Encounter for general adult medical examination without abnormal findings: Secondary | ICD-10-CM

## 2023-12-29 DIAGNOSIS — Z125 Encounter for screening for malignant neoplasm of prostate: Secondary | ICD-10-CM

## 2023-12-29 DIAGNOSIS — I10 Essential (primary) hypertension: Secondary | ICD-10-CM

## 2023-12-29 DIAGNOSIS — Z0001 Encounter for general adult medical examination with abnormal findings: Secondary | ICD-10-CM | POA: Diagnosis not present

## 2023-12-29 DIAGNOSIS — E7800 Pure hypercholesterolemia, unspecified: Secondary | ICD-10-CM | POA: Diagnosis not present

## 2023-12-29 MED ORDER — HYDROCHLOROTHIAZIDE 25 MG PO TABS: 25.0000 mg | ORAL_TABLET | Freq: Every day | ORAL | 3 refills | Status: AC

## 2023-12-29 MED ORDER — ROSUVASTATIN CALCIUM 10 MG PO TABS: 10.0000 mg | ORAL_TABLET | Freq: Every day | ORAL | 3 refills | Status: AC

## 2023-12-29 NOTE — Progress Notes (Signed)
 Subjective:    Patient ID: Travis Tyler, male    DOB: [birth year], 58 y.o.   MRN: [id number]  Patient is here today for complete physical exam.  He is due for a tetanus shot.  He is also due for the pneumonia vaccine and the shingles vaccine.  He defers the pneumonia and shingles vaccine today but does request a tetanus shot.  Patient had Cologuard last year that was negative.  He is due again after December 2027.  He is due for prostate cancer screening.  Last year's physical, the patient's cholesterol is extremely high.  We started him on rosuvastatin however he discontinued the medication after it ran out.  His blood pressure is extremely high today.  He does report that he is smoking. No past medical history on file. No past surgical history on file. Current Outpatient Medications on File Prior to Visit  Medication Sig Dispense Refill   acetaminophen (TYLENOL) 650 MG CR tablet Take 650 mg by mouth every 8 (eight) hours as needed for pain. (Patient not taking: Reported on 12/29/2023)     meloxicam (MOBIC) 15 MG tablet TAKE 1 TABLET(15 MG) BY MOUTH DAILY (Patient not taking: Reported on 12/29/2023) 30 tablet 0   No current facility-administered medications on file prior to visit.   No Known Allergies Social History   Socioeconomic History   Marital status: Divorced    Spouse name: Not on file   Number of children: Not on file   Years of education: Not on file   Highest education level: Not on file  Occupational History   Not on file  Tobacco Use   Smoking status: Every Day    Current packs/day: 0.25    Types: Cigarettes   Smokeless tobacco: Never   Tobacco comments:    Uses e-cig  Substance and Sexual Activity   Alcohol use: Yes    Alcohol/week: 0.0 standard drinks of alcohol   Drug use: No   Sexual activity: Yes    Birth control/protection: None  Other Topics Concern   Not on file  Social History Narrative   Not on file   Social Drivers of Health   Financial  Resource Strain: Not on file  Food Insecurity: Not on file  Transportation Needs: Not on file  Physical Activity: Not on file  Stress: Not on file  Social Connections: Not on file  Intimate Partner Violence: Not on file     Review of Systems  All other systems reviewed and are negative.      Objective:   Physical Exam Vitals reviewed.  Constitutional:      General: He is not in acute distress.    Appearance: Normal appearance. He is not ill-appearing, toxic-appearing or diaphoretic.  HENT:     Head: Normocephalic and atraumatic.     Right Ear: Tympanic membrane and ear canal normal.     Left Ear: Tympanic membrane and ear canal normal.     Nose: No congestion or rhinorrhea.     Mouth/Throat:     Mouth: Mucous membranes are moist.     Pharynx: Oropharynx is clear. No oropharyngeal exudate or posterior oropharyngeal erythema.  Eyes:     General: No scleral icterus.       Right eye: No discharge.        Left eye: No discharge.     Extraocular Movements: Extraocular movements intact.     Conjunctiva/sclera: Conjunctivae normal.     Pupils: Pupils are equal, round, and reactive to  light.  Neck:     Vascular: No carotid bruit.  Cardiovascular:     Rate and Rhythm: Normal rate and regular rhythm.     Pulses: Normal pulses.          Dorsalis pedis pulses are 2+ on the right side and 2+ on the left side.       Posterior tibial pulses are 2+ on the right side and 2+ on the left side.     Heart sounds: Normal heart sounds. No murmur heard. Pulmonary:     Effort: Pulmonary effort is normal. No respiratory distress.     Breath sounds: Normal breath sounds. No wheezing, rhonchi or rales.  Abdominal:     General: Abdomen is flat. Bowel sounds are normal. There is no distension.     Palpations: Abdomen is soft.     Tenderness: There is no abdominal tenderness. There is no guarding or rebound.  Musculoskeletal:        General: No swelling, tenderness, deformity or signs of injury.  Normal range of motion.     Cervical back: Neck supple. No rigidity or tenderness.     Right lower leg: No edema.     Left lower leg: No edema.  Lymphadenopathy:     Cervical: No cervical adenopathy.  Skin:    Coloration: Skin is not jaundiced.     Findings: No erythema, lesion or rash.  Neurological:     General: No focal deficit present.     Mental Status: He is alert and oriented to person, place, and time. Mental status is at baseline.     Cranial Nerves: No cranial nerve deficit.     Sensory: No sensory deficit.     Motor: No weakness.     Coordination: Coordination normal.     Gait: Gait normal.     Deep Tendon Reflexes: Reflexes normal.           Assessment & Plan:  Prostate cancer screening - Plan: PSA  Elevated cholesterol - Plan: rosuvastatin (CRESTOR) 10 MG tablet, CBC with Differential/Platelet, COMPLETE METABOLIC PANEL WITH GFR, Lipid panel  Adult general medical exam  Benign essential HTN Blood pressure is extremely high.  Begin hydrochlorothiazide 25 mg daily and recheck blood pressure in 2 weeks.  Start checking blood pressure at home.  Patient has history of hyperlipidemia.  Average symptoms include statin 2 g a day.  I will check a lipid panel and a CMP today.  Colon cancer screening is up-to-date.  Check a PSA to screen for prostate cancer.  Patient received his tetanus shot today.  He defers the shingles vaccine and the pneumonia shot to a later date

## 2023-12-30 LAB — COMPLETE METABOLIC PANEL WITH GFR
AG Ratio: 1.4 (calc) (ref 1.0–2.5)
ALT: 11 U/L (ref 9–46)
AST: 16 U/L (ref 10–35)
Albumin: 4.1 g/dL (ref 3.6–5.1)
Alkaline phosphatase (APISO): 79 U/L (ref 35–144)
BUN: 15 mg/dL (ref 7–25)
CO2: 23 mmol/L (ref 20–32)
Calcium: 9.1 mg/dL (ref 8.6–10.3)
Chloride: 107 mmol/L (ref 98–110)
Creat: 1.21 mg/dL (ref 0.70–1.30)
Globulin: 3 g/dL (ref 1.9–3.7)
Glucose, Bld: 83 mg/dL (ref 65–99)
Potassium: 4.1 mmol/L (ref 3.5–5.3)
Sodium: 140 mmol/L (ref 135–146)
Total Bilirubin: 1 mg/dL (ref 0.2–1.2)
Total Protein: 7.1 g/dL (ref 6.1–8.1)

## 2023-12-30 LAB — CBC WITH DIFFERENTIAL/PLATELET
Absolute Lymphocytes: 1573 {cells}/uL (ref 850–3900)
Absolute Monocytes: 402 {cells}/uL (ref 200–950)
Basophils Absolute: 33 {cells}/uL (ref 0–200)
Basophils Relative: 0.6 %
Eosinophils Absolute: 121 {cells}/uL (ref 15–500)
Eosinophils Relative: 2.2 %
HCT: 47.4 % (ref 38.5–50.0)
Hemoglobin: 16.1 g/dL (ref 13.2–17.1)
MCH: 31.9 pg (ref 27.0–33.0)
MCHC: 34 g/dL (ref 32.0–36.0)
MCV: 94 fL (ref 80.0–100.0)
MPV: 10.5 fL (ref 7.5–12.5)
Monocytes Relative: 7.3 %
Neutro Abs: 3372 {cells}/uL (ref 1500–7800)
Neutrophils Relative %: 61.3 %
Platelets: 249 10*3/uL (ref 140–400)
RBC: 5.04 10*6/uL (ref 4.20–5.80)
RDW: 12.5 % (ref 11.0–15.0)
Total Lymphocyte: 28.6 %
WBC: 5.5 10*3/uL (ref 3.8–10.8)

## 2023-12-30 LAB — LIPID PANEL
Cholesterol: 246 mg/dL — ABNORMAL HIGH (ref <200)
HDL: 42 mg/dL (ref >=40)
LDL Cholesterol (Calc): 172 mg/dL — ABNORMAL HIGH
Non-HDL Cholesterol (Calc): 204 mg/dL — ABNORMAL HIGH (ref <130)
Total CHOL/HDL Ratio: 5.9 (calc) — ABNORMAL HIGH (ref <5.0)
Triglycerides: 171 mg/dL — ABNORMAL HIGH (ref <150)

## 2023-12-30 LAB — PSA: PSA: 0.51 ng/mL (ref <=4.00)

## 2024-01-12 ENCOUNTER — Ambulatory Visit: Admitting: Family Medicine

## 2024-01-25 ENCOUNTER — Ambulatory Visit: Admitting: Family Medicine

## 2024-12-30 ENCOUNTER — Encounter: Admitting: Family Medicine
